# Patient Record
Sex: Male | Born: 1965 | Race: Black or African American | Hispanic: No | Marital: Married | Smoking: Former smoker
Health system: Southern US, Community
[De-identification: ages and names within clinical notes are randomized; demographics above are authoritative.]

## PROBLEM LIST (undated history)

## (undated) DIAGNOSIS — E119 Type 2 diabetes mellitus without complications: Secondary | ICD-10-CM

## (undated) HISTORY — PX: SHOULDER SURGERY: SHX246

## (undated) HISTORY — PX: ANKLE SURGERY: SHX546

## (undated) HISTORY — PX: KNEE SURGERY: SHX244

---

## 2000-10-16 ENCOUNTER — Emergency Department (HOSPITAL_COMMUNITY): Admission: EM | Admit: 2000-10-16 | Discharge: 2000-10-16 | Payer: Self-pay | Admitting: Emergency Medicine

## 2003-01-14 ENCOUNTER — Emergency Department (HOSPITAL_COMMUNITY): Admission: EM | Admit: 2003-01-14 | Discharge: 2003-01-14 | Payer: Self-pay | Admitting: Emergency Medicine

## 2003-01-22 ENCOUNTER — Emergency Department (HOSPITAL_COMMUNITY): Admission: EM | Admit: 2003-01-22 | Discharge: 2003-01-22 | Payer: Self-pay | Admitting: Emergency Medicine

## 2003-02-12 ENCOUNTER — Encounter: Payer: Self-pay | Admitting: Emergency Medicine

## 2003-02-12 ENCOUNTER — Inpatient Hospital Stay (HOSPITAL_COMMUNITY): Admission: EM | Admit: 2003-02-12 | Discharge: 2003-02-15 | Payer: Self-pay | Admitting: Emergency Medicine

## 2003-08-07 ENCOUNTER — Emergency Department (HOSPITAL_COMMUNITY): Admission: EM | Admit: 2003-08-07 | Discharge: 2003-08-07 | Payer: Self-pay | Admitting: Emergency Medicine

## 2005-08-14 ENCOUNTER — Emergency Department (HOSPITAL_COMMUNITY): Admission: EM | Admit: 2005-08-14 | Discharge: 2005-08-14 | Payer: Self-pay | Admitting: Emergency Medicine

## 2005-10-08 ENCOUNTER — Emergency Department (HOSPITAL_COMMUNITY): Admission: EM | Admit: 2005-10-08 | Discharge: 2005-10-08 | Payer: Self-pay | Admitting: Family Medicine

## 2005-10-26 ENCOUNTER — Inpatient Hospital Stay (HOSPITAL_COMMUNITY): Admission: EM | Admit: 2005-10-26 | Discharge: 2005-10-31 | Payer: Self-pay | Admitting: Emergency Medicine

## 2005-10-28 ENCOUNTER — Ambulatory Visit: Payer: Self-pay | Admitting: Physical Medicine & Rehabilitation

## 2005-11-08 ENCOUNTER — Emergency Department (HOSPITAL_COMMUNITY): Admission: EM | Admit: 2005-11-08 | Discharge: 2005-11-08 | Payer: Self-pay | Admitting: Emergency Medicine

## 2005-12-27 ENCOUNTER — Emergency Department (HOSPITAL_COMMUNITY): Admission: EM | Admit: 2005-12-27 | Discharge: 2005-12-27 | Payer: Self-pay | Admitting: Emergency Medicine

## 2006-06-22 ENCOUNTER — Emergency Department (HOSPITAL_COMMUNITY): Admission: EM | Admit: 2006-06-22 | Discharge: 2006-06-22 | Payer: Self-pay | Admitting: Emergency Medicine

## 2006-08-04 ENCOUNTER — Emergency Department (HOSPITAL_COMMUNITY): Admission: EM | Admit: 2006-08-04 | Discharge: 2006-08-04 | Payer: Self-pay | Admitting: Emergency Medicine

## 2006-08-10 ENCOUNTER — Emergency Department (HOSPITAL_COMMUNITY): Admission: EM | Admit: 2006-08-10 | Discharge: 2006-08-10 | Payer: Self-pay | Admitting: Emergency Medicine

## 2007-08-28 ENCOUNTER — Emergency Department (HOSPITAL_COMMUNITY): Admission: EM | Admit: 2007-08-28 | Discharge: 2007-08-28 | Payer: Self-pay | Admitting: Emergency Medicine

## 2008-01-08 ENCOUNTER — Emergency Department (HOSPITAL_COMMUNITY): Admission: EM | Admit: 2008-01-08 | Discharge: 2008-01-08 | Payer: Self-pay | Admitting: Emergency Medicine

## 2008-01-21 ENCOUNTER — Emergency Department: Payer: Self-pay | Admitting: Emergency Medicine

## 2008-02-20 ENCOUNTER — Emergency Department (HOSPITAL_COMMUNITY): Admission: EM | Admit: 2008-02-20 | Discharge: 2008-02-20 | Payer: Self-pay | Admitting: Emergency Medicine

## 2008-03-15 ENCOUNTER — Emergency Department (HOSPITAL_COMMUNITY): Admission: EM | Admit: 2008-03-15 | Discharge: 2008-03-15 | Payer: Self-pay | Admitting: Emergency Medicine

## 2008-05-20 ENCOUNTER — Emergency Department (HOSPITAL_COMMUNITY): Admission: EM | Admit: 2008-05-20 | Discharge: 2008-05-20 | Payer: Self-pay | Admitting: Emergency Medicine

## 2008-09-12 ENCOUNTER — Emergency Department (HOSPITAL_COMMUNITY): Admission: EM | Admit: 2008-09-12 | Discharge: 2008-09-12 | Payer: Self-pay | Admitting: Emergency Medicine

## 2009-07-01 ENCOUNTER — Emergency Department (HOSPITAL_COMMUNITY): Admission: EM | Admit: 2009-07-01 | Discharge: 2009-07-02 | Payer: Self-pay | Admitting: Emergency Medicine

## 2009-10-16 ENCOUNTER — Encounter: Admission: RE | Admit: 2009-10-16 | Discharge: 2009-11-19 | Payer: Self-pay | Admitting: Orthopedic Surgery

## 2010-08-04 ENCOUNTER — Emergency Department (HOSPITAL_COMMUNITY): Payer: Self-pay

## 2010-08-04 ENCOUNTER — Emergency Department (HOSPITAL_COMMUNITY)
Admission: EM | Admit: 2010-08-04 | Discharge: 2010-08-04 | Disposition: A | Discharge status: Home / Self Care | Payer: Self-pay | Attending: Emergency Medicine | Admitting: Emergency Medicine

## 2010-08-04 DIAGNOSIS — M542 Cervicalgia: Secondary | ICD-10-CM | POA: Insufficient documentation

## 2010-08-04 DIAGNOSIS — G8929 Other chronic pain: Secondary | ICD-10-CM | POA: Insufficient documentation

## 2010-08-04 DIAGNOSIS — Z79899 Other long term (current) drug therapy: Secondary | ICD-10-CM | POA: Insufficient documentation

## 2010-08-04 DIAGNOSIS — M549 Dorsalgia, unspecified: Secondary | ICD-10-CM | POA: Insufficient documentation

## 2010-08-04 DIAGNOSIS — R079 Chest pain, unspecified: Secondary | ICD-10-CM | POA: Insufficient documentation
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
## 2013-11-01 ENCOUNTER — Encounter (HOSPITAL_COMMUNITY): Payer: Self-pay | Admitting: Emergency Medicine

## 2013-11-01 ENCOUNTER — Emergency Department (INDEPENDENT_AMBULATORY_CARE_PROVIDER_SITE_OTHER)
Admission: EM | Admit: 2013-11-01 | Discharge: 2013-11-01 | Disposition: A | Discharge status: Home / Self Care | Payer: Self-pay | Source: Home / Self Care | Attending: Family Medicine | Admitting: Family Medicine

## 2013-11-01 DIAGNOSIS — Z760 Encounter for issue of repeat prescription: Secondary | ICD-10-CM

## 2013-11-01 DIAGNOSIS — E119 Type 2 diabetes mellitus without complications: Secondary | ICD-10-CM

## 2013-11-01 HISTORY — DX: Type 2 diabetes mellitus without complications: E11.9

## 2013-11-01 LAB — POCT I-STAT, CHEM 8
BUN: 9 mg/dL (ref 6–23)
CHLORIDE: 104 meq/L (ref 96–112)
CREATININE: 1 mg/dL (ref 0.50–1.35)
Calcium, Ion: 1.24 mmol/L — ABNORMAL HIGH (ref 1.12–1.23)
Glucose, Bld: 169 mg/dL — ABNORMAL HIGH (ref 70–99)
HCT: 48 % (ref 39.0–52.0)
HEMOGLOBIN: 16.3 g/dL (ref 13.0–17.0)
Potassium: 3.6 mEq/L — ABNORMAL LOW (ref 3.7–5.3)
SODIUM: 142 meq/L (ref 137–147)
TCO2: 22 mmol/L (ref 0–100)

## 2013-11-01 LAB — POCT URINALYSIS DIP (DEVICE)
Bilirubin Urine: NEGATIVE
Glucose, UA: NEGATIVE mg/dL
KETONES UR: NEGATIVE mg/dL
LEUKOCYTES UA: NEGATIVE
NITRITE: NEGATIVE
Protein, ur: NEGATIVE mg/dL
SPECIFIC GRAVITY, URINE: 1.02 (ref 1.005–1.030)
Urobilinogen, UA: 0.2 mg/dL (ref 0.0–1.0)
pH: 6 (ref 5.0–8.0)

## 2013-11-01 NOTE — Discharge Instructions (Signed)
Blood Glucose Monitoring, Adult Monitoring your blood glucose (also know as blood sugar) helps you to manage your diabetes. It also helps you and your health care provider monitor your diabetes and determine how well your treatment plan is working. WHY SHOULD YOU MONITOR YOUR BLOOD GLUCOSE?  It can help you understand how food, exercise, and medicine affect your blood glucose.  It allows you to know what your blood glucose is at any given moment. You can quickly tell if you are having low blood glucose (hypoglycemia) or high blood glucose (hyperglycemia).  It can help you and your health care provider know how to adjust your medicines.  It can help you understand how to manage an illness or adjust medicine for exercise. WHEN SHOULD YOU TEST? Your health care provider will help you decide how often you should check your blood glucose. This may depend on the type of diabetes you have, your diabetes control, or the types of medicines you are taking. Be sure to write down all of your blood glucose readings so that this information can be reviewed with your health care provider. See below for examples of testing times that your health care provider may suggest. Type 1 Diabetes  Test 4 times a day if you are in good control, using an insulin pump, or perform multiple daily injections.  If your diabetes is not well-controlled or if you are sick, you may need to monitor more often.  It is a good idea to also monitor:  Before and after exercise.  Between meals and 2 hours after a meal.  Occasionally between 2:00 to 3:00 am. Type 2 Diabetes  It can vary with each person, but generally, if you are on insulin, test 4 times a day.  If you take medicines by mouth (orally), test 2 times a day.  If you are on a controlled diet, test once a day.  If your diabetes is not well controlled or if you are sick, you may need to monitor more often. HOW TO MONITOR YOUR BLOOD GLUCOSE Supplies  Needed  Blood glucose meter.  Test strips for your meter. Each meter has its own strips. You must use the strips that go with your own meter.  A pricking needle (lancet).  A device that holds the lancet (lancing device).  A journal or log book to write down your results. Procedure  Wash your hands with soap and water. Alcohol is not preferred.  Prick the side of your finger (not the tip) with the lancet.  Gently milk the finger until a small drop of blood appears.  Follow the instructions that come with your meter for inserting the test strip, applying blood to the strip, and using your blood glucose meter. Other Areas to Get Blood for Testing Some meters allow you to use other areas of your body (other than your finger) to test your blood. These areas are called alternative sites. The most common alternative sites are:  The forearm.  The thigh.  The back area of the lower leg.  The palm of the hand. The blood flow in these areas is slower. Therefore, the blood glucose values you get may be delayed, and the numbers are different from what you would get from your fingers. Do not use alternative sites if you think you are having hypoglycemia. Your reading will not be accurate. Always use a finger if you are having hypoglycemia. Also, if you cannot feel your lows (hypoglycemia unawareness), always use your fingers for your blood  glucose checks. ADDITIONAL TIPS FOR GLUCOSE MONITORING  Do not reuse lancets.  Always carry your supplies with you.  All blood glucose meters have a 24-hour "hotline" number to call if you have questions or need help.  Adjust (calibrate) your blood glucose meter with a control solution after finishing a few boxes of strips. BLOOD GLUCOSE RECORD KEEPING It is a good idea to keep a daily record or log of your blood glucose readings. Most glucose meters, if not all, keep your glucose records stored in the meter. Some meters come with the ability to download  your records to your home computer. Keeping a record of your blood glucose readings is especially helpful if you are wanting to look for patterns. Make notes to go along with the blood glucose readings because you might forget what happened at that exact time. Keeping good records helps you and your health care provider to work together to achieve good diabetes management.  Document Released: 06/03/2003 Document Revised: 01/31/2013 Document Reviewed: 10/23/2012 [facility] Patient Information 2014 [place].  Diabetes and Exercise Exercising regularly is important. It is not just about losing weight. It has many health benefits, such as:  Improving your overall fitness, flexibility, and endurance.  Increasing your bone density.  Helping with weight control.  Decreasing your body fat.  Increasing your muscle strength.  Reducing stress and tension.  Improving your overall health. People with diabetes who exercise gain additional benefits because exercise:  Reduces appetite.  Improves the body's use of blood sugar (glucose).  Helps lower or control blood glucose.  Decreases blood pressure.  Helps control blood lipids (such as cholesterol and triglycerides).  Improves the body's use of the hormone insulin by:  Increasing the body's insulin sensitivity.  Reducing the body's insulin needs.  Decreases the risk for heart disease because exercising:  Lowers cholesterol and triglycerides levels.  Increases the levels of good cholesterol (such as high-density lipoproteins [HDL]) in the body.  Lowers blood glucose levels. YOUR ACTIVITY PLAN  Choose an activity that you enjoy and set realistic goals. Your health care provider or diabetes educator can help you make an activity plan that works for you. You can break activities into 2 or 3 sessions throughout the day. Doing so is as good as one long session. Exercise ideas include:  Taking the dog for a walk.  Taking the stairs  instead of the elevator.  Dancing to your favorite song.  Doing your favorite exercise with a friend. RECOMMENDATIONS FOR EXERCISING WITH TYPE 1 OR TYPE 2 DIABETES   Check your blood glucose before exercising. If blood glucose levels are greater than 240 mg/dL, check for urine ketones. Do not exercise if ketones are present.  Avoid injecting insulin into areas of the body that are going to be exercised. For example, avoid injecting insulin into:  The arms when playing tennis.  The legs when jogging.  Keep a record of:  Food intake before and after you exercise.  Expected peak times of insulin action.  Blood glucose levels before and after you exercise.  The type and amount of exercise you have done.  Review your records with your health care provider. Your health care provider will help you to develop guidelines for adjusting food intake and insulin amounts before and after exercising.  If you take insulin or oral hypoglycemic agents, watch for signs and symptoms of hypoglycemia. They include:  Dizziness.  Shaking.  Sweating.  Chills.  Confusion.  Drink plenty of water while you exercise  to prevent dehydration or heat stroke. Body water is lost during exercise and must be replaced.  Talk to your health care provider before starting an exercise program to make sure it is safe for you. Remember, almost any type of activity is better than none. Document Released: 08/21/2003 Document Revised: 01/31/2013 Document Reviewed: 11/07/2012 [facility] Patient Information 2014 [place].  Diabetes and Foot Care Diabetes may cause you to have problems because of poor blood supply (circulation) to your feet and legs. This may cause the skin on your feet to become thinner, break easier, and heal more slowly. Your skin may become dry, and the skin may peel and crack. You may also have nerve damage in your legs and feet causing decreased feeling in them. You may not notice minor  injuries to your feet that could lead to infections or more serious problems. Taking care of your feet is one of the most important things you can do for yourself.  HOME CARE INSTRUCTIONS  Wear shoes at all times, even in the house. Do not go barefoot. Bare feet are easily injured.  Check your feet daily for blisters, cuts, and redness. If you cannot see the bottom of your feet, use a mirror or ask someone for help.  Wash your feet with warm water (do not use hot water) and mild soap. Then pat your feet and the areas between your toes until they are completely dry. Do not soak your feet as this can dry your skin.  Apply a moisturizing lotion or petroleum jelly (that does not contain alcohol and is unscented) to the skin on your feet and to dry, brittle toenails. Do not apply lotion between your toes.  Trim your toenails straight across. Do not dig under them or around the cuticle. File the edges of your nails with an emery board or nail file.  Do not cut corns or calluses or try to remove them with medicine.  Wear clean socks or stockings every day. Make sure they are not too tight. Do not wear knee-high stockings since they may decrease blood flow to your legs.  Wear shoes that fit properly and have enough cushioning. To break in new shoes, wear them for just a few hours a day. This prevents you from injuring your feet. Always look in your shoes before you put them on to be sure there are no objects inside.  Do not cross your legs. This may decrease the blood flow to your feet.  If you find a minor scrape, cut, or break in the skin on your feet, keep it and the skin around it clean and dry. These areas may be cleansed with mild soap and water. Do not cleanse the area with peroxide, alcohol, or iodine.  When you remove an adhesive bandage, be sure not to damage the skin around it.  If you have a wound, look at it several times a day to make sure it is healing.  Do not use heating pads or  hot water bottles. They may burn your skin. If you have lost feeling in your feet or legs, you may not know it is happening until it is too late.  Make sure your health care provider performs a complete foot exam at least annually or more often if you have foot problems. Report any cuts, sores, or bruises to your health care provider immediately. SEEK MEDICAL CARE IF:   You have an injury that is not healing.  You have cuts or breaks in  the skin.  You have an ingrown nail.  You notice redness on your legs or feet.  You feel burning or tingling in your legs or feet.  You have pain or cramps in your legs and feet.  Your legs or feet are numb.  Your feet always feel cold. SEEK IMMEDIATE MEDICAL CARE IF:   There is increasing redness, swelling, or pain in or around a wound.  There is a red line that goes up your leg.  Pus is coming from a wound.  You develop a fever or as directed by your health care provider.  You notice a bad smell coming from an ulcer or wound. Document Released: 05/28/2000 Document Revised: 01/31/2013 Document Reviewed: 11/07/2012 [facility] Patient Information 2014 [place], [place].  Diabetes Meal Planning Guide The diabetes meal planning guide is a tool to help you plan your meals and snacks. It is important for people with diabetes to manage their blood glucose (sugar) levels. Choosing the right foods and the right amounts throughout your day will help control your blood glucose. Eating right can even help you improve your blood pressure and reach or maintain a healthy weight. CARBOHYDRATE COUNTING MADE EASY When you eat carbohydrates, they turn to sugar. This raises your blood glucose level. Counting carbohydrates can help you control this level so you feel better. When you plan your meals by counting carbohydrates, you can have more flexibility in what you eat and balance your medicine with your food intake. Carbohydrate counting simply means adding up the  total amount of carbohydrate grams in your meals and snacks. Try to eat about the same amount at each meal. Foods with carbohydrates are listed below. Each portion below is 1 carbohydrate serving or 15 grams of carbohydrates. Ask your dietician how many grams of carbohydrates you should eat at each meal or snack. Grains and Starches  1 slice bread.   English muffin or hotdog/hamburger bun.   cup cold cereal (unsweetened).   cup cooked pasta or rice.   cup starchy vegetables (corn, potatoes, peas, beans, winter squash).  1 tortilla (6 inches).   bagel.  1 waffle or pancake (size of a CD).   cup cooked cereal.  4 to 6 small crackers. *Whole grain is recommended. Fruit  1 cup fresh unsweetened berries, melon, papaya, pineapple.  1 small fresh fruit.   banana or mango.   cup fruit juice (4 oz unsweetened).   cup canned fruit in natural juice or water.  2 tbs dried fruit.  12 to 15 grapes or cherries. Milk and Yogurt  1 cup fat-free or 1% milk.  1 cup soy milk.  6 oz light yogurt with sugar-free sweetener.  6 oz low-fat soy yogurt.  6 oz plain yogurt. Vegetables  1 cup raw or  cup cooked is counted as 0 carbohydrates or a "free" food.  If you eat 3 or more servings at 1 meal, count them as 1 carbohydrate serving. Other Carbohydrates   oz chips or pretzels.   cup ice cream or frozen yogurt.   cup sherbet or sorbet.  2 inch square cake, no frosting.  1 tbs honey, sugar, jam, jelly, or syrup.  2 small cookies.  3 squares of Linse crackers.  3 cups popcorn.  6 crackers.  1 cup broth-based soup.  Count 1 cup casserole or other mixed foods as 2 carbohydrate servings.  Foods with less than 20 calories in a serving may be counted as 0 carbohydrates or a "free" food. You may want  to purchase a book or computer software that lists the carbohydrate gram counts of different foods. In addition, the nutrition facts panel on the labels of the  foods you eat are a good source of this information. The label will tell you how big the serving size is and the total number of carbohydrate grams you will be eating per serving. Divide this number by 15 to obtain the number of carbohydrate servings in a portion. Remember, 1 carbohydrate serving equals 15 grams of carbohydrate. SERVING SIZES Measuring foods and serving sizes helps you make sure you are getting the right amount of food. The list below tells how big or small some common serving sizes are.  1 oz.........4 stacked dice.  3 oz........Marland Kitchen.Deck of cards.  1 tsp.......Marland Kitchen.Tip of little finger.  1 tbs......Marland Kitchen.Marland Kitchen.Thumb.  2 tbs.......Marland Kitchen.Golf ball.   cup......Marland Kitchen.Half of a fist.  1 cup.......Marland Kitchen.A fist. SAMPLE DIABETES MEAL PLAN Below is a sample meal plan that includes foods from the grain and starches, dairy, vegetable, fruit, and meat groups. A dietician can individualize a meal plan to fit your calorie needs and tell you the number of servings needed from each food group. However, controlling the total amount of carbohydrates in your meal or snack is more important than making sure you include all of the food groups at every meal. You may interchange carbohydrate containing foods (dairy, starches, and fruits). The meal plan below is an example of a 2000 calorie diet using carbohydrate counting. This meal plan has 17 carbohydrate servings. Breakfast  1 cup oatmeal (2 carb servings).   cup light yogurt (1 carb serving).  1 cup blueberries (1 carb serving).   cup almonds. Snack  1 large apple (2 carb servings).  1 low-fat string cheese stick. Lunch  Chicken breast salad.  1 cup spinach.   cup chopped tomatoes.  2 oz chicken breast, sliced.  2 tbs low-fat [place] dressing.  12 whole-wheat crackers (2 carb servings).  12 to 15 grapes (1 carb serving).  1 cup low-fat milk (1 carb serving). Snack  1 cup carrots.   cup hummus (1 carb serving). Dinner  3 oz broiled  salmon.  1 cup brown rice (3 carb servings). Snack  1  cups steamed broccoli (1 carb serving) drizzled with 1 tsp olive oil and lemon juice.  1 cup light pudding (2 carb servings). DIABETES MEAL PLANNING WORKSHEET Your dietician can use this worksheet to help you decide how many servings of foods and what types of foods are right for you.  BREAKFAST Food Group and Servings / Carb Servings Grain/Starches __________________________________ Dairy __________________________________________ Vegetable ______________________________________ Fruit ___________________________________________ Meat __________________________________________ Fat ____________________________________________ LUNCH Food Group and Servings / Carb Servings Grain/Starches ___________________________________ Dairy ___________________________________________ Fruit ____________________________________________ Meat ___________________________________________ Fat _____________________________________________ Laural GoldenINNER Food Group and Servings / Carb Servings Grain/Starches ___________________________________ Dairy ___________________________________________ Fruit ____________________________________________ Meat ___________________________________________ Fat _____________________________________________ SNACKS Food Group and Servings / Carb Servings Grain/Starches ___________________________________ Dairy ___________________________________________ Vegetable _______________________________________ Fruit ____________________________________________ Meat ___________________________________________ Fat _____________________________________________ DAILY TOTALS Starches _________________________ Vegetable ________________________ Fruit ____________________________ Dairy ____________________________ Meat ____________________________ Fat ______________________________ Document Released: 02/25/2005 Document Revised:  08/23/2011 Document Reviewed: 01/06/2009 [employer] Patient Information 2014 [place].

## 2013-11-01 NOTE — ED Provider Notes (Signed)
CSN: [id number]     Arrival date & time 11/01/13  1031 History   First MD Initiated Contact with Patient 11/01/13 1051     Chief Complaint  Patient presents with  . Medication Refill   (Consider location/radiation/quality/duration/timing/severity/associated sxs/prior Treatment) HPI Comments: Patient was released from prison one week ago and presents requesting refill Rx's for his insulin, glucometer testing supplies, and medication for chronic pain. [facility] but initial appointment is not until 11-21-2013.  Denies polyuria and polydipsia.    The history is provided by the patient.    Past Medical History  Diagnosis Date  . Diabetes mellitus without complication    Past Surgical History  Procedure Laterality Date  . Knee surgery    . Ankle surgery    . Shoulder surgery     History reviewed. No pertinent family history. History  Substance Use Topics  . Smoking status: Current Every Day Smoker  . Smokeless tobacco: Not on file  . Alcohol Use: No    Review of Systems  Constitutional: Negative.   HENT: Negative.   Eyes: Negative.   Respiratory: Negative.   Cardiovascular: Negative.   Gastrointestinal: Negative.   Endocrine: Negative for polydipsia, polyphagia and polyuria.  Genitourinary: Negative.   Musculoskeletal: Negative.   Skin: Negative.   Neurological: Negative.   Psychiatric/Behavioral: Negative.     Allergies  Review of patient's allergies indicates not on file.  Home Medications   Prior to Admission medications   Not on File   BP 130/84  Pulse 68  Temp(Src) 97.1 F (36.2 C) (Oral)  Resp 18  SpO2 100% Physical Exam  Nursing note and vitals reviewed. Constitutional: He is oriented to person, place, and time. He appears well-developed and well-nourished. No distress.  HENT:  Head: Normocephalic and atraumatic.  Eyes: Conjunctivae are normal. No scleral icterus.  Neck:  Normal range of motion. Neck supple.  Cardiovascular: Normal rate, regular rhythm and normal heart sounds.   Pulmonary/Chest: Effort normal and breath sounds normal.  Abdominal: Soft. Bowel sounds are normal. He exhibits no distension. There is no tenderness.  Musculoskeletal: Normal range of motion.  Neurological: He is alert and oriented to person, place, and time.  Skin: Skin is warm and dry.  Psychiatric: He has a normal mood and affect. His behavior is normal.    ED Course  Procedures (including critical care time) Labs Review Labs Reviewed  POCT URINALYSIS DIP (DEVICE) - Abnormal; Notable for the following:    Hgb urine dipstick TRACE (*)    All other components within normal limits  POCT I-STAT, CHEM 8 - Abnormal; Notable for the following:    Potassium 3.6 (*)    Glucose, Bld 169 (*)    Calcium, Ion 1.24 (*)    All other components within normal limits    Imaging Review No results found.   MDM   1. Diabetes   2. Encounter for medication refill    Advised patient that he would need to discuss chronic pain management with his provider [facility]. Encourage to use either tylenol or ibuprofen as directed on packaging for pain. Provided patient with prescriptions for insulin 70/30, 20units SQ Qam and insulin 70/30 10 until SQ Qpm, glucometer test strips, lancets, and insulin syringes. These were placed on hand written prescriptions and duplicate copies to be scanned into patient's chart. UA and Istat 8 only notable for mild hyperglcemia.  Jess BartersJennifer Lee LorenzoPresson, GeorgiaPA 11/01/13 1330

## 2013-11-01 NOTE — ED Notes (Signed)
Pt  Reports   He is  A  Diabetic   And  Needs     His  Diabetic  meds  And   Pain  Pills       He  Reports  He  Is  Also    Out  Of his  supplys  As  Well            He  Apparently    Has  No PCP       He  Is  Sitting  Upright  On  The  Exam  Table  Speaking      In  Complete  sentances  And  Is  In no  Acute  Distress

## 2013-11-05 NOTE — ED Provider Notes (Signed)
Medical screening examination/treatment/procedure(s) were performed by a resident physician or non-physician practitioner and as the supervising physician I was immediately available for consultation/collaboration.  Clementeen Fasig, MD    Rodolph Bong, MD 11/05/13 [phone]

## 2013-11-16 ENCOUNTER — Emergency Department (HOSPITAL_COMMUNITY)
Admission: EM | Admit: 2013-11-16 | Discharge: 2013-11-16 | Disposition: A | Discharge status: Home / Self Care | Payer: Medicaid Other | Attending: Emergency Medicine | Admitting: Emergency Medicine

## 2013-11-16 ENCOUNTER — Encounter (HOSPITAL_COMMUNITY): Payer: Self-pay | Admitting: Emergency Medicine

## 2013-11-16 DIAGNOSIS — Z87891 Personal history of nicotine dependence: Secondary | ICD-10-CM | POA: Insufficient documentation

## 2013-11-16 DIAGNOSIS — E119 Type 2 diabetes mellitus without complications: Secondary | ICD-10-CM | POA: Insufficient documentation

## 2013-11-16 DIAGNOSIS — G8929 Other chronic pain: Secondary | ICD-10-CM | POA: Diagnosis not present

## 2013-11-16 DIAGNOSIS — M25552 Pain in left hip: Secondary | ICD-10-CM

## 2013-11-16 DIAGNOSIS — M25569 Pain in unspecified knee: Secondary | ICD-10-CM | POA: Insufficient documentation

## 2013-11-16 DIAGNOSIS — M25562 Pain in left knee: Secondary | ICD-10-CM

## 2013-11-16 DIAGNOSIS — M25559 Pain in unspecified hip: Secondary | ICD-10-CM | POA: Diagnosis not present

## 2013-11-16 DIAGNOSIS — M25561 Pain in right knee: Secondary | ICD-10-CM

## 2013-11-16 MED ORDER — GABAPENTIN 100 MG PO CAPS: 100.0000 mg | ORAL_CAPSULE | Freq: Two times a day (BID) | ORAL | Status: AC

## 2013-11-16 NOTE — ED Notes (Signed)
Pt refused wc. Ambulates without distress.
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
## 2013-11-16 NOTE — ED Notes (Signed)
Pt states recently released from prison, does not have medication for chronic bilateral knee pain and left hip pain. Denies new injury.

## 2013-11-16 NOTE — ED Notes (Signed)
Accessed record for billing question

## 2013-11-16 NOTE — Discharge Instructions (Signed)
Read the information below.  Use the prescribed medication as directed.  Please discuss all new medications with your pharmacist.  You may return to the Emergency Department at any time for worsening condition or any new symptoms that concern you.  If you develop uncontrolled pain, weakness or numbness of the extremity, severe discoloration of the skin, or you are unable to walk, return to the ER for a recheck.      Chronic Pain Chronic pain can be defined as pain that is off and on and lasts for 3 6 months or longer. Many things cause chronic pain, which can make it difficult to make a diagnosis. There are many treatment options available for chronic pain. However, finding a treatment that works well for you may require trying various approaches until the right one is found. Many people benefit from a combination of two or more types of treatment to control their pain. SYMPTOMS  Chronic pain can occur anywhere in the body and can range from mild to very severe. Some types of chronic pain include:  Headache.  Low back pain.  Cancer pain.  Arthritis pain.  Neurogenic pain. This is pain resulting from damage to nerves. People with chronic pain may also have other symptoms such as:  Depression.  Anger.  Insomnia.  Anxiety. DIAGNOSIS  Your health care provider will help diagnose your condition over time. In many cases, the initial focus will be on excluding possible conditions that could be causing the pain. Depending on your symptoms, your health care provider may order tests to diagnose your condition. Some of these tests may include:   Blood tests.   CT scan.   MRI.   X-rays.   Ultrasounds.   Nerve conduction studies.  You may need to see a specialist.  TREATMENT  Finding treatment that works well may take time. You may be referred to a pain specialist. He or she may prescribe medicine or therapies, such as:   Mindful meditation or yoga.  Shots (injections) of  numbing or pain-relieving medicines into the spine or area of pain.  Local electrical stimulation.  Acupuncture.   Massage therapy.   Aroma, color, light, or sound therapy.   Biofeedback.   Working with a physical therapist to keep from getting stiff.   Regular, gentle exercise.   Cognitive or behavioral therapy.   Group support.  Sometimes, surgery may be recommended.  HOME CARE INSTRUCTIONS   Take all medicines as directed by your health care provider.   Lessen stress in your life by relaxing and doing things such as listening to calming music.   Exercise or be active as directed by your health care provider.   Eat a healthy diet and include things such as vegetables, fruits, fish, and lean meats in your diet.   Keep all follow-up appointments with your health care provider.   Attend a support group with others suffering from chronic pain. SEEK MEDICAL CARE IF:   Your pain gets worse.   You develop a new pain that was not there before.   You cannot tolerate medicines given to you by your health care provider.   You have new symptoms since your last visit with your health care provider.  SEEK IMMEDIATE MEDICAL CARE IF:   You feel weak.   You have decreased sensation or numbness.   You lose control of bowel or bladder function.   Your pain suddenly gets much worse.   You develop shaking.  You develop chills.  You  develop confusion.  You develop chest pain.  You develop shortness of breath.  MAKE SURE YOU:  Understand these instructions.  Will watch your condition.  Will get help right away if you are not doing well or get worse. Document Released: 02/20/2002 Document Revised: 01/31/2013 Document Reviewed: 11/24/2012 [facility] Patient Information 2014 [place], [place].  Knee Pain Knee pain can be a result of an injury or other medical conditions. Treatment will depend on the cause of your pain. HOME CARE  Only take medicine  as told by your doctor.  Keep a healthy weight. Being overweight can make the knee hurt more.  Stretch before exercising or playing sports.  If there is constant knee pain, change the way you exercise. Ask your doctor for advice.  Make sure shoes fit well. Choose the right shoe for the sport or activity.  Protect your knees. Wear kneepads if needed.  Rest when you are tired. GET HELP RIGHT AWAY IF:   Your knee pain does not stop.  Your knee pain does not get better.  Your knee joint feels hot to the touch.  You have a fever. MAKE SURE YOU:   Understand these instructions.  Will watch this condition.  Will get help right away if you are not doing well or get worse. Document Released: 08/27/2008 Document Revised: 08/23/2011 Document Reviewed: 08/27/2008 [facility] Patient Information 2014 [place], [place].  Hip Pain The hips join the upper legs to the lower pelvis. The bones, cartilage, tendons, and muscles of the hip joint perform a lot of work each day holding your body weight and allowing you to move around. Hip pain is a common symptom. It can range from a minor ache to severe pain on 1 or both hips. Pain may be felt on the inside of the hip joint near the groin, or the outside near the buttocks and upper thigh. There may be swelling or stiffness as well. It occurs more often when a person walks or performs activity. There are many reasons hip pain can develop. CAUSES  It is important to work with your caregiver to identify the cause since many conditions can impact the bones, cartilage, muscles, and tendons of the hips. Causes for hip pain include:  Broken (fractured) bones.  Separation of the thighbone from the hip socket (dislocation).  Torn cartilage of the hip joint.  Swelling (inflammation) of a tendon (tendonitis), the sac within the hip joint (bursitis), or a joint.  A weakening in the abdominal wall (hernia), affecting the nerves to the hip.  Arthritis in the  hip joint or lining of the hip joint.  Pinched nerves in the back, hip, or upper thigh.  A bulging disc in the spine (herniated disc).  Rarely, bone infection or cancer. DIAGNOSIS  The location of your hip pain will help your caregiver understand what may be causing the pain. A diagnosis is based on your medical history, your symptoms, results from your physical exam, and results from diagnostic tests. Diagnostic tests may include X-ray exams, a computerized magnetic scan (magnetic resonance imaging, MRI), or bone scan. TREATMENT  Treatment will depend on the cause of your hip pain. Treatment may include:  Limiting activities and resting until symptoms improve.  Crutches or other walking supports (a cane or brace).  Ice, elevation, and compression.  Physical therapy or home exercises.  Shoe inserts or special shoes.  Losing weight.  Medications to reduce pain.  Undergoing surgery. HOME CARE INSTRUCTIONS   Only take over-the-counter or prescription medicines for  pain, discomfort, or fever as directed by your caregiver.  Put ice on the injured area:  Put ice in a plastic bag.  Place a towel between your skin and the bag.  Leave the ice on for 15-20 minutes at a time, 03-04 times a day.  Keep your leg raised (elevated) when possible to lessen swelling.  Avoid activities that cause pain.  Follow specific exercises as directed by your caregiver.  Sleep with a pillow between your legs on your most comfortable side.  Record how often you have hip pain, the location of the pain, and what it feels like. This information may be helpful to you and your caregiver.  Ask your caregiver about returning to work or sports and whether you should drive.  Follow up with your caregiver for further exams, therapy, or testing as directed. SEEK MEDICAL CARE IF:   Your pain or swelling continues or worsens after 1 week.  You are feeling unwell or have chills.  You have increasing  difficulty with walking.  You have a loss of sensation or other new symptoms.  You have questions or concerns. SEEK IMMEDIATE MEDICAL CARE IF:   You cannot put weight on the affected hip.  You have fallen.  You have a sudden increase in pain and swelling in your hip.  You have a fever. MAKE SURE YOU:   Understand these instructions.  Will watch your condition.  Will get help right away if you are not doing well or get worse. Document Released: 11/18/2009 Document Revised: 08/23/2011 Document Reviewed: 11/18/2009 [facility] Patient Information 2014 [place], [place].

## 2013-11-17 NOTE — ED Provider Notes (Signed)
Medical screening examination/treatment/procedure(s) were performed by non-physician practitioner and as supervising physician I was immediately available for consultation/collaboration.   EKG Interpretation None       Flint Melter, MD 11/17/13 0900

## 2013-11-21 ENCOUNTER — Ambulatory Visit: Payer: Medicaid Other | Attending: Internal Medicine

## 2013-11-28 ENCOUNTER — Encounter (HOSPITAL_COMMUNITY): Payer: Self-pay | Admitting: Emergency Medicine

## 2013-11-28 ENCOUNTER — Emergency Department (HOSPITAL_COMMUNITY)
Admission: EM | Admit: 2013-11-28 | Discharge: 2013-11-28 | Disposition: A | Discharge status: Home / Self Care | Payer: Medicaid Other | Attending: Emergency Medicine | Admitting: Emergency Medicine

## 2013-11-28 DIAGNOSIS — E119 Type 2 diabetes mellitus without complications: Secondary | ICD-10-CM | POA: Diagnosis not present

## 2013-11-28 DIAGNOSIS — K0889 Other specified disorders of teeth and supporting structures: Secondary | ICD-10-CM

## 2013-11-28 DIAGNOSIS — Z87891 Personal history of nicotine dependence: Secondary | ICD-10-CM | POA: Diagnosis not present

## 2013-11-28 DIAGNOSIS — K089 Disorder of teeth and supporting structures, unspecified: Secondary | ICD-10-CM | POA: Diagnosis present

## 2013-11-28 DIAGNOSIS — Z79899 Other long term (current) drug therapy: Secondary | ICD-10-CM | POA: Diagnosis not present

## 2013-11-28 DIAGNOSIS — K08109 Complete loss of teeth, unspecified cause, unspecified class: Secondary | ICD-10-CM | POA: Insufficient documentation

## 2013-11-28 MED ORDER — HYDROCODONE-ACETAMINOPHEN 5-325 MG PO TABS: 1.0000 | ORAL_TABLET | ORAL | Status: DC | PRN

## 2013-11-28 MED ORDER — PENICILLIN V POTASSIUM 500 MG PO TABS
500.0000 mg | ORAL_TABLET | Freq: Once | ORAL | Status: AC
  Administered 2013-11-28: 500 mg via ORAL
  Filled 2013-11-28: qty 1

## 2013-11-28 MED ORDER — HYDROCODONE-ACETAMINOPHEN 5-325 MG PO TABS
2.0000 | ORAL_TABLET | Freq: Once | ORAL | Status: AC
  Administered 2013-11-28: 2 via ORAL
  Filled 2013-11-28: qty 2

## 2013-11-28 MED ORDER — PENICILLIN V POTASSIUM 500 MG PO TABS: 500.0000 mg | ORAL_TABLET | Freq: Four times a day (QID) | ORAL | Status: DC

## 2013-11-28 NOTE — ED Provider Notes (Signed)
CSN: [id number]     Arrival date & time 11/28/13  0818 History   First MD Initiated Contact with Patient 11/28/13 1018     Chief Complaint  Patient presents with  . Dental Pain     (Consider location/radiation/quality/duration/timing/severity/associated sxs/prior Treatment) HPI Comments: The patient is a 48 year old male who presents with dental pain that started gradually last night. The dental pain is severe, constant and progressively worsening. The pain is aching and located in left upper jaw. The pain does not radiate. Eating makes the pain worse. Nothing makes the pain better. The patient has not tried anything for pain. No associated symptoms. Patient denies headache, neck pain/stiffness, fever, NVD, edema, sore throat, throat swelling, wheezing, SOB, chest pain, abdominal pain.      Past Medical History  Diagnosis Date  . Diabetes mellitus without complication    Past Surgical History  Procedure Laterality Date  . Knee surgery    . Ankle surgery    . Shoulder surgery     History reviewed. No pertinent family history. History  Substance Use Topics  . Smoking status: Former Games developermoker  . Smokeless tobacco: Not on file  . Alcohol Use: No    Review of Systems  Constitutional: Negative for fever, chills and fatigue.  HENT: Positive for dental problem. Negative for trouble swallowing.   Eyes: Negative for visual disturbance.  Respiratory: Negative for shortness of breath.   Cardiovascular: Negative for chest pain and palpitations.  Gastrointestinal: Negative for nausea, vomiting, abdominal pain and diarrhea.  Genitourinary: Negative for dysuria and difficulty urinating.  Musculoskeletal: Negative for arthralgias and neck pain.  Skin: Negative for color change.  Neurological: Negative for dizziness and weakness.  Psychiatric/Behavioral: Negative for dysphoric mood.      Allergies  Review of patient's allergies indicates no known allergies.  Home Medications   Prior  to Admission medications   Medication Sig Start Date End Date Taking? Authorizing Provider  acetaminophen (TYLENOL) 500 MG tablet Take 1,000 mg by mouth every 6 (six) hours as needed for mild pain.    Historical Provider, MD  gabapentin (NEURONTIN) 100 MG capsule Take 1 capsule (100 mg total) by mouth 2 (two) times daily. 11/16/13   Trixie DredgeEmily West, PA-C  ibuprofen (ADVIL,MOTRIN) 200 MG tablet Take 200 mg by mouth every 6 (six) hours as needed for mild pain.    Historical Provider, MD   BP 139/81  Pulse 66  Temp(Src) 98.3 F (36.8 C) (Oral)  Resp 18  SpO2 100% Physical Exam  Nursing note and vitals reviewed. Constitutional: He is oriented to person, place, and time. He appears well-developed and well-nourished. No distress.  HENT:  Head: Normocephalic and atraumatic.  Mouth/Throat: Oropharynx is clear and moist. No oropharyngeal exudate.  Poor dentition. Multiple missing teeth. Left upper incisor tenderness to percussion. This tooth is loose and appears to have some decay.   Mild left side facial swelling.   Eyes: Conjunctivae and EOM are normal.  Neck: Normal range of motion.  Cardiovascular: Normal rate and regular rhythm.  Exam reveals no gallop and no friction rub.   No murmur heard. Pulmonary/Chest: Effort normal and breath sounds normal. He has no wheezes. He has no rales. He exhibits no tenderness.  Musculoskeletal: Normal range of motion.  Lymphadenopathy:    He has no cervical adenopathy.  Neurological: He is alert and oriented to person, place, and time. Coordination normal.  Speech is goal-oriented. Moves limbs without ataxia.   Skin: Skin is warm and dry.  Psychiatric: He has a normal mood and affect. His behavior is normal.    ED Course  Procedures (including critical care time) Labs Review Labs Reviewed - No data to display  Imaging Review No results found.   EKG Interpretation None      MDM   Final diagnoses:  Pain, dental    10:27 AM Patient will have  Vicodin and Veetid for symptoms. Patient will have a resource guide for dental follow up. Vitals stable and patient afebrile. Patient instructed to return to the ED with worsening or concerning symptoms    Emilia BeckKaitlyn Szekalski, PA-C 11/28/13 1041

## 2013-11-28 NOTE — ED Provider Notes (Signed)
Medical screening examination/treatment/procedure(s) were performed by non-physician practitioner and as supervising physician I was immediately available for consultation/collaboration.   EKG Interpretation None        Forrest S Harrison, MD 11/28/13 1613 

## 2013-11-28 NOTE — Discharge Instructions (Signed)
Take Veetid as directed until gone. Take Vicodin for pain as needed. Refer to attached documents for more information. Follow up with the dentist from the resource guide.    Emergency Department Resource Guide 1) Find a Doctor and Pay Out of Pocket Although you won't have to find out who is covered by your insurance plan, it is a good idea to ask around and get recommendations. You will then need to call the office and see if the doctor you have chosen will accept you as a new patient and what types of options they offer for patients who are self-pay. Some doctors offer discounts or will set up payment plans for their patients who do not have insurance, but you will need to ask so you aren't surprised when you get to your appointment.  2) Contact Your Local Health Department Not all health departments have doctors that can see patients for sick visits, but many do, so it is worth a call to see if yours does. If you don't know where your local health department is, you can check in your phone book. The CDC also has a tool to help you locate your state's health department, and many state websites also have listings of all of their local health departments.  3) Find a Walk-in Clinic If your illness is not likely to be very severe or complicated, you may want to try a walk in clinic. These are popping up all over the country in pharmacies, drugstores, and shopping centers. They're usually staffed by nurse practitioners or physician assistants that have been trained to treat common illnesses and complaints. They're usually fairly quick and inexpensive. However, if you have serious medical issues or chronic medical problems, these are probably not your best option.  No Primary Care Doctor: - Call Health Connect at  [phone] - they can help you locate a primary care doctor that  accepts your insurance, provides certain services, etc. - Physician Referral Service- [phone]  Chronic Pain  Problems: Organization         Address  Phone   Notes  [facility]  [phone] Patients need to be referred by their primary care doctor.   Medication Assistance: Organization         Address  Phone   Notes  [facility] [address] [place], [place] [address] [phone] --Must be a resident of [place] -- Must have NO insurance coverage whatsoever (no Medicaid/ Medicare, etc.) -- The pt. MUST have a primary care doctor that directs their care regularly and follows them in the community   MedAssist  [phone]) [phone]   [employer]  [phone]    Agencies that provide inexpensive medical care: Organization         Address  Phone   Notes  [facility]  [phone]) [phone]   [facility]    [phone]   [facility] [address] [place], [place] [address] [phone]) [phone]   [facility] [address], [place] [phone]) [phone]   Planned Parenthood    [phone](336) [phone]   [facility]    [phone]   [facility]  [address], [place] Phone:  [phone]) [phone], Fax:  [phone] Hours of Operation:  9 am - 6 pm, M-F.  Also accepts Medicaid/Medicare and self-pay.  [facility]  [address]  [place], [place], [place] Phone: [phone], Fax: [phone]. Hours of Operation:  8:30 am - 5:30 pm, M-F.  Also accepts Medicaid and self-pay.  [facility] [place] [address], [place] Phone: [phone]   [place], [place], [place] [phone], Ext. [phone] Mondays & Thursdays: 7-9 AM.  First 15 patients are seen on a first come, first serve basis.    [place] Providers:  Organization         Address  Phone   Notes  [facility] [address], [place] [phone] Also  accepts self-pay patients.  [facility] [address], [place]  [phone]   [place], [address], [place] [phone]   [facility] [address] [place] [phone]   Lucianne Lei [address], [place]   [phone] Only accepts [place] Access [place] patients after they have their name applied to their card.   Self-Pay (no insurance) in [facility]:  Organization         Address  Phone   Notes  Sickle Cell Patients, [facility] [place] [phone]   [facility] [place] [phone]   [facility] [place]  [place], [place],  [phone]   [facility]/Dr. Osei-Bonsu  [address] [place] or [address], [place] [phone] Phone number for both [place] and [place] locations is the same.  [facility]gent [facility] [address], [place] [phone]   [facility] [address] [place] or [address] [phone]   [facility] [address], [place] [phone], phone; [phone], fax Sees patients 1st and 3rd Saturday of every month.  Must not qualify for public or private insurance (i.e. Medicaid, Medicare, [place] Health Choice, Veterans' Benefits)  Household income should be no more than 200% of the poverty level The clinic cannot treat you if you are pregnant or think you are pregnant  Sexually transmitted diseases are not treated at the clinic.    Dental Care: Organization         Address  Phone  Notes  [facility] [place] [phone] Accepts children up to age 34 who are enrolled in [place] or [place]; pregnant  women with a Medicaid card; and children who have applied for Medicaid or [place] Health Choice, but were declined, whose parents can pay a reduced fee at time of service.  [facility]  [address], [place] [phone] Accepts children up to age 24 who are enrolled in [place] or [place]; pregnant women with a Medicaid card; and children who have applied for Medicaid or [place] Health Choice, but were declined, whose parents can pay a reduced fee at time of service.  [facility]  [place] [phone] Patients are seen by appointment only. Walk-ins are not accepted. [place] will see patients 57 years of age and older. Monday - Tuesday (8am-5pm) Most Wednesdays (8:30-5pm) $30 per visit, cash only  [place]  [address], [place] [phone] Patients are seen by appointment only. Walk-ins are not accepted. [facility] will see patients 18 years of age and older. °One Wednesday Evening (Monthly: Volunteer Based).  $30 per visit, cash only  °[facility]  [phone] for adults; Children under age 4, call Graduate Pediatric Dentistry at [phone]. Children aged 4-14, please call [phone] to request a pediatric application. ° Dental services are provided in all areas of dental care including fillings, crowns and bridges, complete and partial dentures, implants, gum treatment, root canals, and extractions. Preventive care is also provided. Treatment is provided to both adults and children. °Patients are selected via a lottery and there is often a waiting list. °  °[facility] [address], °[place] ° [phone] [email or web address] °  °[facility] [address], [place], [place] [phone], Ext. [phone] Second and Fourth Thursday of each month, opens at 6:30 AM; Clinic ends at 9 AM.  Patients are  seen on a first-come first-served basis, and a limited number are seen during each clinic.  ° °[facility] ° [address], [place], [place] [phone]   Eligibility Requirements °You must have lived in [place], [place], or [place] counties for at least the last three months. °  You cannot be eligible for state or federal sponsored healthcare insurance, including Veterans Administration, Medicaid, or Medicare. °  You generally cannot be eligible for healthcare insurance through your employer.  °  How to apply: °Eligibility screenings are held every Tuesday and Wednesday afternoon from 1:00 pm until 4:00 pm. You do not need an appointment for the interview!  °[facility] [address], [place], [place] [phone]   °[facility]  [phone]   °[place] [facility]  [phone]   °[facility]  [phone]   ° °Behavioral Health Resources in the Community: °Intensive Outpatient Programs °Organization         Address  Phone  Notes  °[facility] [address], [place], [place] [phone]   °[facility] [address], [place], [place] [phone]   °[facility] [address], [place], [place] ° [phone]   °[facility] [address],  °[place], [place] 1-800-853-5163 or 336-641-4981   °Substance Abuse Resources °Organization         Address  Phone  Notes  °[facility]  336-882-2125   °[facility]  336-784-9470   °[facility]  336-285-9073   °[facility]  336-845-3988   °Residential & Outpatient Substance Abuse Program  1-800-659-3381   °Psychological Services °Organization         Address  Phone  Notes  ° Health  336- [phone]   °[facility]  336- 378-7881   °[facility] [address], [place] 1-800-853-5163 or 336-641-4981   ° °Mobile Crisis  Teams °Organization         Address  Phone  Notes  °[facility], Mobile Crisis Care Unit  1-877-626-1772   °Assertive °Psychotherapeutic Services ° [address] [place], [place] 336-834-9664   °Sharon DeEsch [address] °[place] 336-554-5454   ° °Self-Help/Support Groups °Organization         Address  Phone             Notes  °[facility] [place] -   variety of support groups  336- 336-389-6044 Call for more information  Narcotics Anonymous (NA), [facility] [address], [employer] [place]  2 meetings at this location   Residential Facilities manager         Address  Phone  Notes  [facility] [place],    [place]  1-463-457-0038   [facility]  [address] [place] [address], [place], [place]   [place], [place] [phone] Admissions: 8am-3pm M-F  [facility] [place] [address],    [place], [place] [phone]   [facility] [address] [place], [place], [place]   [facility] [address]  [place], [place]   [facility] - Intensive Outpatient [address], [place], [place]   [facility] [place] [address]  [place], [place] 1-640-386-3300 or 325-254-7062   [facility] ([facility]) [address] [place], [place] Accepts Medicaid  [facility] [place] [address],  [place] 1-646-193-9807 Substance Abuse/Addiction Treatment   [facility] Organization         Address  Phone  Notes  [facility]  [phone]   Domenic Schwab, PhD [address] [place], [place]   (925) [phone] or [phone]   [facility] [place], [place] [phone]   [facility] [address] [place], [place] [phone]  Insurance/Medicaid/sponsorship through [facility] and Families [address]                                    [place], [place] [phone] [place] [address], [place] [phone]    Dr. Adele Schilder  [phone]   [facility] Dept. 1) [address] [place] 2) [place] 3)  [place] [address], [place] [phone]  [phone]   [place] [phone] or [phone] (After Hours)

## 2013-11-28 NOTE — Progress Notes (Signed)
[facility] provided pt with a list of primary care resources and dental resources.

## 2013-11-28 NOTE — ED Notes (Signed)
Pt c/o toothache to lt side since last night.

## 2014-01-14 ENCOUNTER — Emergency Department (HOSPITAL_COMMUNITY)
Admission: EM | Admit: 2014-01-14 | Discharge: 2014-01-14 | Disposition: A | Discharge status: Home / Self Care | Payer: Medicaid Other | Attending: Emergency Medicine | Admitting: Emergency Medicine

## 2014-01-14 ENCOUNTER — Encounter (HOSPITAL_COMMUNITY): Payer: Self-pay | Admitting: Emergency Medicine

## 2014-01-14 DIAGNOSIS — K089 Disorder of teeth and supporting structures, unspecified: Secondary | ICD-10-CM | POA: Insufficient documentation

## 2014-01-14 DIAGNOSIS — Z87891 Personal history of nicotine dependence: Secondary | ICD-10-CM | POA: Diagnosis not present

## 2014-01-14 DIAGNOSIS — E119 Type 2 diabetes mellitus without complications: Secondary | ICD-10-CM | POA: Diagnosis not present

## 2014-01-14 DIAGNOSIS — Z79899 Other long term (current) drug therapy: Secondary | ICD-10-CM | POA: Diagnosis not present

## 2014-01-14 DIAGNOSIS — K029 Dental caries, unspecified: Secondary | ICD-10-CM | POA: Diagnosis not present

## 2014-01-14 DIAGNOSIS — Z792 Long term (current) use of antibiotics: Secondary | ICD-10-CM | POA: Diagnosis not present

## 2014-01-14 DIAGNOSIS — K044 Acute apical periodontitis of pulpal origin: Secondary | ICD-10-CM | POA: Diagnosis not present

## 2014-01-14 DIAGNOSIS — K047 Periapical abscess without sinus: Secondary | ICD-10-CM

## 2014-01-14 MED ORDER — PENICILLIN V POTASSIUM 500 MG PO TABS: 500.0000 mg | ORAL_TABLET | Freq: Three times a day (TID) | ORAL | Status: DC

## 2014-01-14 MED ORDER — HYDROCODONE-ACETAMINOPHEN 5-325 MG PO TABS: ORAL_TABLET | ORAL | Status: DC

## 2014-01-14 MED ORDER — NAPROXEN 500 MG PO TABS: 500.0000 mg | ORAL_TABLET | Freq: Two times a day (BID) | ORAL | Status: DC

## 2014-01-14 NOTE — ED Notes (Signed)
Pt here for dental pain, sts he got out of prison before he could have them removed for free.

## 2014-01-14 NOTE — ED Provider Notes (Signed)
CSN: [id number]     Arrival date & time 01/14/14  1240 History  This chart was scribed for non-physician practitioner, Rhea Bleacher, PA-C working with Flint Melter, MD, by Jarvis Morgan, ED Scribe. This patient was seen in room TR05C/TR05C and the patient's care was started at 1:11 PM.   Chief Complaint  Patient presents with  . Dental Pain     The history is provided by the patient. No language interpreter was used.   HPI Comments: Mike Small is a 48 y.o. male with a h/o DM w/o complication who presents to the Emergency Department complaining of constant, gradually worsening, moderate, "10/10", dental pain to two teeth to the front of his mouth. He states that he feels like his teeth need to be pulled and feels like they may be infected. He states that he got out of prison before he could have them removed. He states he is having associated swelling of his gums. He denies any drooling, fever, nausea, or emesis.    Past Medical History  Diagnosis Date  . Diabetes mellitus without complication    Past Surgical History  Procedure Laterality Date  . Knee surgery    . Ankle surgery    . Shoulder surgery     History reviewed. No pertinent family history. History  Substance Use Topics  . Smoking status: Former Games developer  . Smokeless tobacco: Not on file  . Alcohol Use: No     Review of Systems  Constitutional: Negative for fever.  HENT: Positive for dental problem (two teeth in front of his mouth. Gum swelling). Negative for drooling, ear pain, facial swelling, sore throat and trouble swallowing.   Respiratory: Negative for shortness of breath and stridor.   Gastrointestinal: Negative for nausea and vomiting.  Musculoskeletal: Negative for neck pain.  Skin: Negative for color change.  Neurological: Negative for headaches.      Allergies  Review of patient's allergies indicates no known allergies.  Home Medications   Prior to Admission medications   Medication Sig  Start Date End Date Taking? Authorizing Provider  acetaminophen (TYLENOL) 500 MG tablet Take 1,000 mg by mouth every 6 (six) hours as needed for mild pain.    Historical Provider, MD  gabapentin (NEURONTIN) 100 MG capsule Take 1 capsule (100 mg total) by mouth 2 (two) times daily. 11/16/13   Trixie Dredge, PA-C  HYDROcodone-acetaminophen (NORCO/VICODIN) 5-325 MG per tablet Take 1-2 tablets by mouth every 4 (four) hours as needed for moderate pain or severe pain. 11/28/13   Kaitlyn Szekalski, PA-C  ibuprofen (ADVIL,MOTRIN) 200 MG tablet Take 200 mg by mouth every 6 (six) hours as needed for mild pain.    Historical Provider, MD  penicillin v potassium (VEETID) 500 MG tablet Take 1 tablet (500 mg total) by mouth 4 (four) times daily. 11/28/13   Emilia Beck, PA-C   Triage Vitals: BP 155/87  Pulse 75  Temp(Src) 97.8 F (36.6 C) (Oral)  Resp 18  Ht 5\' 8"  (1.727 m)  Wt 218 lb (98.884 kg)  BMI 33.15 kg/m2  SpO2 97%  Physical Exam  Nursing note and vitals reviewed. Constitutional: He appears well-developed and well-nourished. No distress.  HENT:  Head: Normocephalic and atraumatic.  Right Ear: Tympanic membrane, external ear and ear canal normal.  Left Ear: Tympanic membrane, external ear and ear canal normal.  Nose: Nose normal.  Mouth/Throat: Uvula is midline, oropharynx is clear and moist and mucous membranes are normal. No trismus in the jaw. Abnormal dentition. Dental  caries present. No dental abscesses or uvula swelling. No tonsillar abscesses.  Patient with R/L maxillary tooth pain and tenderness to palpation in area of bilateral bicuspids. Mild swelling and erythema noted on exam. Advanced erosions of gums, advanced periodontal disease.   Eyes: Conjunctivae and EOM are normal. Pupils are equal, round, and reactive to light.  Neck: Normal range of motion. Neck supple. No tracheal deviation present.  No neck swelling or Lugwig's angina  Cardiovascular: Normal rate.   Pulmonary/Chest:  Effort normal. No respiratory distress.  Musculoskeletal: Normal range of motion.  Neurological: He is alert.  Skin: Skin is warm and dry.  Psychiatric: He has a normal mood and affect. His behavior is normal.    ED Course  Procedures (including critical care time)  DIAGNOSTIC STUDIES: Oxygen Saturation is 97% on RA, normal by my interpretation.    COORDINATION OF CARE: 1:17 PM- Will discharge pt with penicillin, Vicodin and Naprosyn and also advised him to f/u with a dentist to have his teeth extracted Pt advised of plan for treatment and pt agrees.  Labs Review Labs Reviewed - No data to display  Imaging Review No results found.   EKG Interpretation None       Vital signs reviewed and are as follows: Filed Vitals:   01/14/14 1244  BP: 155/87  Pulse: 75  Temp: 97.8 F (36.6 C)  Resp: 18    Patient counseled to take prescribed medications as directed, return with worsening facial or neck swelling, and to follow-up with their dentist as soon as possible.   Patient counseled on use of narcotic pain medications. Counseled not to combine these medications with others containing tylenol. Urged not to drink alcohol, drive, or perform any other activities that requires focus while taking these medications. The patient verbalizes understanding and agrees with the plan.   MDM   Final diagnoses:  Dental infection   Patient with toothache. No fever. Exam unconcerning for Ludwig's angina or other deep tissue infection in neck.   As there is gum swelling, erythema, will treat with antibiotic and pain medicine. Urged patient to follow-up with dentist.    I personally performed the services described in this documentation, which was scribed in my presence. The recorded information has been reviewed and is accurate.    Renne CriglerJoshua Demetrice Amstutz, PA-C 01/14/14 1322

## 2014-01-14 NOTE — Discharge Instructions (Signed)
Please read and follow all provided instructions.  Your diagnoses today include:  1. Dental infection    The exam and treatment you received today has been provided on an emergency basis only. This is not a substitute for complete medical or dental care.  Tests performed today include:  Vital signs. See below for your results today.   Medications prescribed:   Penicillin - antibiotic  You have been prescribed an antibiotic medicine: take the entire course of medicine even if you are feeling better. Stopping early can cause the antibiotic not to work.   Vicodin (hydrocodone/acetaminophen) - narcotic pain medication  DO NOT drive or perform any activities that require you to be awake and alert because this medicine can make you drowsy. BE VERY CAREFUL not to take multiple medicines containing Tylenol (also called acetaminophen). Doing so can lead to an overdose which can damage your liver and cause liver failure and possibly death.   Naproxen - anti-inflammatory pain medication  Do not exceed 500mg  naproxen every 12 hours, take with food  You have been prescribed an anti-inflammatory medication or NSAID. Take with food. Take smallest effective dose for the shortest duration needed for your pain. Stop taking if you experience stomach pain or vomiting.   Take any prescribed medications only as directed.  Home care instructions:  Follow any educational materials contained in this packet.  Follow-up instructions: Please follow-up with your dentist for further evaluation of your symptoms.   Dental Assistance: See below for dental referrals  Return instructions:   Please return to the Emergency Department if you experience worsening symptoms.  Please return if you develop a fever, you develop more swelling in your face or neck, you have trouble breathing or swallowing food.  Please return if you have any other emergent concerns.  Additional Information:  Your vital signs  today were: BP 155/87   Pulse 75   Temp(Src) 97.8 F (36.6 C) (Oral)   Resp 18   Ht 5\' 8"  (1.727 m)   Wt 218 lb (98.884 kg)   BMI 33.15 kg/m2   SpO2 97% If your blood pressure (BP) was elevated above 135/85 this visit, please have this repeated by your doctor within one month. -------------- Dental Care: Organization         Address  Phone  Notes  [facility] [address] [place], [place] [phone] Accepts children up to age 7 who are enrolled in [place] or [place] Health Choice; pregnant women with a Medicaid card; and children who have applied for Medicaid or [place] Health Choice, but were declined, whose parents can pay a reduced fee at time of service.  [facility] Department of [facility]  [address], [place] [phone] Accepts children up to age 40 who are enrolled in [place] or [place] Health Choice; pregnant women with a Medicaid card; and children who have applied for Medicaid or [place] Health Choice, but were declined, whose parents can pay a reduced fee at time of service.  [facility]  [address] [place], [place] [phone] Patients are seen by appointment only. Walk-ins are not accepted. [facility] will see patients 35 years of age and older. Monday - Tuesday (8am-5pm) Most Wednesdays (8:30-5pm) $30 per visit, cash only  [facility]  [address], [facility] [phone] Patients are seen by appointment only. Walk-ins are not accepted. [facility] will see  patients 49 years of age and older. One Wednesday Evening (Monthly: Volunteer Based).  $30 per visit, cash only  [facility]  [phone] for adults; Children under age 374, call Graduate Pediatric Dentistry at [phone]. Children aged 354-14, please call [phone] to request a pediatric application.  Dental services are provided in all areas of  dental care including fillings, crowns and bridges, complete and partial dentures, implants, gum treatment, root canals, and extractions. Preventive care is also provided. Treatment is provided to both adults and children. Patients are selected via a lottery and there is often a waiting list.   [facility] [address], [place]  [phone] [email or web address]   [facility] [address], [place], [place] [phone], [phone]. [phone] Second and Fourth Thursday of each month, opens at 6:30 AM; Clinic ends at 9 AM.  Patients are seen on a first-come first-served basis, and a limited number are seen during each clinic.   [facility]  [address], [place], [place] [phone]   Eligibility Requirements You must have lived in [place], [place], or [place] counties for at least the last three months.   You cannot be eligible for state or federal sponsored [place] insurance, including [employer], [place], or [employer]icare.   You generally cannot be eligible for healthcare insurance through your employer.    How to apply: Eligibility screenings are held every Tuesday and Wednesday afternoon from 1:00 pm until 4:00 pm. You do not need an appointment for the interview!  [facility] [address], [place], [place] [phone]   [facility]  [phone]   [facility]  [phone]   [facility]  [phone]

## 2014-01-14 NOTE — ED Notes (Signed)
Declined W/C at D/C and was escorted to lobby by RN. 

## 2014-01-14 NOTE — ED Provider Notes (Signed)
Medical screening examination/treatment/procedure(s) were performed by non-physician practitioner and as supervising physician I was immediately available for consultation/collaboration.  Temeka Pore L Nirvana Blanchett, MD 01/14/14 1639 

## 2014-02-11 ENCOUNTER — Other Ambulatory Visit (HOSPITAL_COMMUNITY): Payer: Self-pay | Admitting: Pain Medicine

## 2014-02-11 ENCOUNTER — Ambulatory Visit (HOSPITAL_COMMUNITY)
Admission: RE | Admit: 2014-02-11 | Discharge: 2014-02-11 | Disposition: A | Discharge status: Home / Self Care | Payer: Medicaid Other | Source: Ambulatory Visit | Attending: Pain Medicine | Admitting: Pain Medicine

## 2014-02-11 ENCOUNTER — Emergency Department (HOSPITAL_COMMUNITY)
Admission: EM | Admit: 2014-02-11 | Discharge: 2014-02-11 | Discharge status: Against Medical Advice | Payer: Medicaid Other

## 2014-02-11 DIAGNOSIS — R52 Pain, unspecified: Secondary | ICD-10-CM

## 2014-02-11 DIAGNOSIS — M533 Sacrococcygeal disorders, not elsewhere classified: Secondary | ICD-10-CM

## 2014-02-11 DIAGNOSIS — G8929 Other chronic pain: Secondary | ICD-10-CM | POA: Diagnosis not present

## 2014-02-11 DIAGNOSIS — M25569 Pain in unspecified knee: Secondary | ICD-10-CM | POA: Insufficient documentation

## 2014-03-04 ENCOUNTER — Other Ambulatory Visit (HOSPITAL_COMMUNITY): Payer: Self-pay | Admitting: Pain Medicine

## 2014-03-04 ENCOUNTER — Ambulatory Visit (HOSPITAL_COMMUNITY)
Admission: RE | Admit: 2014-03-04 | Discharge: 2014-03-04 | Disposition: A | Discharge status: Home / Self Care | Payer: Medicaid Other | Source: Ambulatory Visit | Attending: Pain Medicine | Admitting: Pain Medicine

## 2014-03-04 ENCOUNTER — Encounter (INDEPENDENT_AMBULATORY_CARE_PROVIDER_SITE_OTHER): Payer: Self-pay

## 2014-03-04 DIAGNOSIS — M79609 Pain in unspecified limb: Secondary | ICD-10-CM | POA: Insufficient documentation

## 2014-03-04 DIAGNOSIS — G8929 Other chronic pain: Secondary | ICD-10-CM | POA: Diagnosis not present

## 2014-03-04 DIAGNOSIS — R52 Pain, unspecified: Secondary | ICD-10-CM

## 2014-03-04 DIAGNOSIS — M2140 Flat foot [pes planus] (acquired), unspecified foot: Secondary | ICD-10-CM | POA: Insufficient documentation

## 2014-03-04 DIAGNOSIS — M25579 Pain in unspecified ankle and joints of unspecified foot: Secondary | ICD-10-CM | POA: Insufficient documentation

## 2014-03-21 ENCOUNTER — Other Ambulatory Visit: Payer: Self-pay | Admitting: Pain Medicine

## 2014-03-21 DIAGNOSIS — M1651 Unilateral post-traumatic osteoarthritis, right hip: Secondary | ICD-10-CM

## 2014-03-21 DIAGNOSIS — M25552 Pain in left hip: Secondary | ICD-10-CM

## 2014-03-21 DIAGNOSIS — M1652 Unilateral post-traumatic osteoarthritis, left hip: Secondary | ICD-10-CM

## 2014-03-21 DIAGNOSIS — M25551 Pain in right hip: Secondary | ICD-10-CM

## 2014-03-29 ENCOUNTER — Ambulatory Visit
Admission: RE | Admit: 2014-03-29 | Discharge: 2014-03-29 | Disposition: A | Discharge status: Home / Self Care | Payer: Medicaid Other | Source: Ambulatory Visit | Attending: Pain Medicine | Admitting: Pain Medicine

## 2014-03-29 DIAGNOSIS — M25552 Pain in left hip: Secondary | ICD-10-CM

## 2014-03-29 DIAGNOSIS — M1651 Unilateral post-traumatic osteoarthritis, right hip: Secondary | ICD-10-CM

## 2014-03-29 DIAGNOSIS — M1652 Unilateral post-traumatic osteoarthritis, left hip: Secondary | ICD-10-CM

## 2014-03-29 DIAGNOSIS — M25551 Pain in right hip: Secondary | ICD-10-CM

## 2015-01-04 ENCOUNTER — Encounter (HOSPITAL_COMMUNITY): Payer: Self-pay | Admitting: *Deleted

## 2015-01-04 ENCOUNTER — Emergency Department (HOSPITAL_COMMUNITY)
Admission: EM | Admit: 2015-01-04 | Discharge: 2015-01-04 | Disposition: A | Discharge status: Home / Self Care | Payer: Medicaid Other | Attending: Emergency Medicine | Admitting: Emergency Medicine

## 2015-01-04 ENCOUNTER — Emergency Department (HOSPITAL_COMMUNITY): Payer: Medicaid Other

## 2015-01-04 DIAGNOSIS — Y9234 Swimming pool (public) as the place of occurrence of the external cause: Secondary | ICD-10-CM | POA: Insufficient documentation

## 2015-01-04 DIAGNOSIS — S93401A Sprain of unspecified ligament of right ankle, initial encounter: Secondary | ICD-10-CM

## 2015-01-04 DIAGNOSIS — Y998 Other external cause status: Secondary | ICD-10-CM | POA: Diagnosis not present

## 2015-01-04 DIAGNOSIS — E119 Type 2 diabetes mellitus without complications: Secondary | ICD-10-CM | POA: Diagnosis not present

## 2015-01-04 DIAGNOSIS — X58XXXA Exposure to other specified factors, initial encounter: Secondary | ICD-10-CM | POA: Diagnosis not present

## 2015-01-04 DIAGNOSIS — Z87891 Personal history of nicotine dependence: Secondary | ICD-10-CM | POA: Diagnosis not present

## 2015-01-04 DIAGNOSIS — Z792 Long term (current) use of antibiotics: Secondary | ICD-10-CM | POA: Insufficient documentation

## 2015-01-04 DIAGNOSIS — S99911A Unspecified injury of right ankle, initial encounter: Secondary | ICD-10-CM | POA: Diagnosis present

## 2015-01-04 DIAGNOSIS — Y9389 Activity, other specified: Secondary | ICD-10-CM | POA: Diagnosis not present

## 2015-01-04 DIAGNOSIS — Z791 Long term (current) use of non-steroidal anti-inflammatories (NSAID): Secondary | ICD-10-CM | POA: Diagnosis not present

## 2015-01-04 DIAGNOSIS — Z79899 Other long term (current) drug therapy: Secondary | ICD-10-CM | POA: Insufficient documentation

## 2015-01-04 MED ORDER — HYDROCODONE-ACETAMINOPHEN 5-325 MG PO TABS: ORAL_TABLET | ORAL | Status: DC

## 2015-01-04 MED ORDER — NAPROXEN 500 MG PO TABS: 500.0000 mg | ORAL_TABLET | Freq: Two times a day (BID) | ORAL | Status: DC

## 2015-01-04 NOTE — Discharge Instructions (Signed)
Please read and follow all provided instructions.  Your diagnoses today include:  1. Ankle sprain, right, initial encounter    Tests performed today include:  An x-ray of your ankle - does NOT show any broken bones, hardware is stable  Vital signs. See below for your results today.   Medications prescribed:   Vicodin (hydrocodone/acetaminophen) - narcotic pain medication  DO NOT drive or perform any activities that require you to be awake and alert because this medicine can make you drowsy. BE VERY CAREFUL not to take multiple medicines containing Tylenol (also called acetaminophen). Doing so can lead to an overdose which can damage your liver and cause liver failure and possibly death.   Naproxen - anti-inflammatory pain medication  Do not exceed  naproxen every 12 hours, take with food  You have been prescribed an anti-inflammatory medication or NSAID. Take with food. Take smallest effective dose for the shortest duration needed for your pain. Stop taking if you experience stomach pain or vomiting.   Take any prescribed medications only as directed.  Home care instructions:   Follow any educational materials contained in this packet  Follow R.I.C.E. Protocol:  R - rest your injury   I  - use ice on injury without applying directly to skin  C - compress injury with bandage or splint  E - elevate the injury as much as possible  Follow-up instructions: Please follow-up with your primary care provider or your orthopedic (bone specialist) if you continue to have significant pain or trouble walking in 1 week. In this case you may have a severe sprain that requires further care.   Return instructions:   Please return if your toes are numb or tingling, appear gray or blue, or you have severe pain (also elevate leg and loosen splint or wrap)  Please return to the Emergency Department if you experience worsening symptoms.   Please return if you have any other emergent  concerns.  Additional Information:  Your vital signs today were: BP 119/74 mmHg   Pulse 64   Temp(Src) 98 F (36.7 C) (Oral)   Resp 19   Ht  (1.727 m)   Wt 214 lb (97.07 kg)   BMI 32.55 kg/m2   SpO2 99% If your blood pressure (BP) was elevated above 135/85 this visit, please have this repeated by your doctor within one month. -------------- Your caregiver has diagnosed you as suffering from an ankle sprain. Ankle sprain occurs when the ligaments that hold the ankle joint together are stretched or torn. It may take 4 to 6 weeks to heal.  For Activity: If prescribed crutches, use crutches with non-weight bearing for the first few days. Then, you may walk on your ankle as the pain allows, or as instructed. Start gradually with weight bearing on the affected ankle. Once you can walk pain free, then try jogging. When you can run forwards, then you can try moving side-to-side. If you cannot walk without crutches in one week, you need a re-check. --------------

## 2015-01-04 NOTE — ED Provider Notes (Signed)
CSN: [id number]     Arrival date & time 01/04/15  7829 History   First MD Initiated Contact with Patient 01/04/15 [phone]     No chief complaint on file.    (Consider location/radiation/quality/duration/timing/severity/associated sxs/prior Treatment) HPI Comments: Patient with history of surgery on his right ankle presents with complaint of ankle pain which started this morning. Patient denies specific injury but states that he was very active in a swimming pool yesterday and thinks he overdid it on his ankle. Patient states that he was able to walk home from the swimming pool without difficulty yesterday. Patient has occasional pain since his previous surgery. No numbness or tingling. No knee pain. No other injuries. No treatments prior to arrival. Onset of symptoms acute. Course is constant. Symptoms worse with ambulation and palpation.  The history is provided by the patient.    Past Medical History  Diagnosis Date  . Diabetes mellitus without complication    Past Surgical History  Procedure Laterality Date  . Knee surgery    . Ankle surgery    . Shoulder surgery     No family history on file. History  Substance Use Topics  . Smoking status: Former Games developer  . Smokeless tobacco: Not on file  . Alcohol Use: No    Review of Systems  Constitutional: Negative for activity change.  Musculoskeletal: Positive for arthralgias and gait problem. Negative for back pain, joint swelling and neck pain.  Skin: Negative for wound.  Neurological: Negative for weakness and numbness.      Allergies  Review of patient's allergies indicates no known allergies.  Home Medications   Prior to Admission medications   Medication Sig Start Date End Date Taking? Authorizing Provider  acetaminophen (TYLENOL) 500 MG tablet Take 1,000 mg by mouth every 6 (six) hours as needed for mild pain.    Historical Provider, MD  gabapentin (NEURONTIN) 100 MG capsule Take 1 capsule (100 mg total) by mouth 2 (two)  times daily. 11/16/13   Trixie Dredge, PA-C  HYDROcodone-acetaminophen (NORCO/VICODIN) 5-325 MG per tablet Take 1-2 tablets every 6 hours as needed for severe pain 01/14/14   Renne Crigler, PA-C  ibuprofen (ADVIL,MOTRIN) 200 MG tablet Take 200 mg by mouth every 6 (six) hours as needed for mild pain.    Historical Provider, MD  naproxen (NAPROSYN) 500 MG tablet Take 1 tablet (500 mg total) by mouth 2 (two) times daily. 01/14/14   Renne Crigler, PA-C  penicillin v potassium (VEETID) 500 MG tablet Take 1 tablet (500 mg total) by mouth 3 (three) times daily. 01/14/14   Renne Crigler, PA-C   BP 119/74 mmHg  Pulse 64  Temp(Src) 98 F (36.7 C) (Oral)  Resp 19  Ht  (1.727 m)  Wt 215 lb (97.523 kg)  BMI 32.70 kg/m2  SpO2 99% Physical Exam  Constitutional: He appears well-developed and well-nourished.  HENT:  Head: Normocephalic and atraumatic.  Eyes: Conjunctivae are normal.  Neck: Normal range of motion. Neck supple.  Cardiovascular:  Pulses:      Dorsalis pedis pulses are 2+ on the right side, and 2+ on the left side.       Posterior tibial pulses are 2+ on the right side, and 2+ on the left side.  Pulmonary/Chest: No respiratory distress.  Musculoskeletal: He exhibits edema and tenderness.  Patient complains of pain with palpation of the medial/lateral right ankle. He denies pain with palpation over the fibular head of the affected side. He denies pain in the hip of  the affected side.  Neurological: He is alert.  Distal motor, sensation, and vascular intact.  Skin: Skin is warm and dry.  Psychiatric: He has a normal mood and affect.  Vitals reviewed.   ED Course  Procedures (including critical care time) Labs Review Labs Reviewed - No data to display  Imaging Review Dg Ankle Complete Right  01/04/2015   CLINICAL DATA:  Twisting injury of right ankle yesterday with ankle pain. History of prior surgery for ankle fractures. Initial encounter.  EXAM: RIGHT ANKLE - COMPLETE 3+ VIEW   COMPARISON:  03/04/2014  FINDINGS: No acute fractures identified. The ankle mortise shows normal alignment. Mild degenerative changes are seen involving the medial ankle mortise. Stable positioning and appearance of hardware after fixation of the distal fibula and medial malleolus. No hardware failure or abnormal lucency surrounding hardware identified. Soft tissues are unremarkable.  IMPRESSION: No acute fracture. Mild degenerative changes of the medial ankle. Fixation hardware appears stable after prior ORIF.   Electronically Signed   By: Irish Lack M.D.   On: 01/04/2015 11:13     EKG Interpretation None       10:00 AM Patient seen and examined. Work-up initiated.   Vital signs reviewed and are as follows: BP 119/74 mmHg  Pulse 64  Temp(Src) 98 F (36.7 C) (Oral)  Resp 19  Ht 5\' 8"  (1.727 m)  Wt 215 lb (97.523 kg)  BMI 32.70 kg/m2  SpO2 99%  Patient updated on x-ray results. X-ray results reviewed by myself. Patient given ASO. He has crutches.  Patient was counseled on RICE protocol and told to rest injury, use ice for no longer than 15 minutes every hour, compress the area, and elevate above the level of their heart as much as possible to reduce swelling. Questions answered. Patient verbalized understanding.    Patient counseled on use of narcotic pain medications. Counseled not to combine these medications with others containing tylenol. Urged not to drink alcohol, drive, or perform any other activities that requires focus while taking these medications. The patient verbalizes understanding and agrees with the plan.  Encouraged PCP follow-up if he continues to have significant pain or trouble walking one week as this may indicate more severe injury.  MDM   Final diagnoses:  Ankle sprain, right, initial encounter   Patient with ankle injury, negative x-rays. Will treat as sprain. PCP follow-up as needed. Lower extremity is neurovascularly intact. Do not suspect compartment  syndrome or DVT.   Renne Crigler, PA-C 01/04/15 1233  Gwyneth Sprout, MD 01/04/15 1630

## 2015-01-04 NOTE — ED Notes (Signed)
Patient transported to X-ray 

## 2015-01-04 NOTE — ED Notes (Signed)
Pt states he twisted his right ankle yesterday, and woke up this morning with pain.

## 2015-01-04 NOTE — ED Notes (Signed)
Pt is in stable condition upon d/c and is escorted via wheelchair from ED. 

## 2016-10-25 ENCOUNTER — Emergency Department (HOSPITAL_COMMUNITY): Payer: Medicaid Other

## 2016-10-25 ENCOUNTER — Encounter (HOSPITAL_COMMUNITY): Payer: Self-pay | Admitting: Emergency Medicine

## 2016-10-25 ENCOUNTER — Emergency Department (HOSPITAL_COMMUNITY)
Admission: EM | Admit: 2016-10-25 | Discharge: 2016-10-25 | Disposition: A | Discharge status: Home / Self Care | Payer: Medicaid Other | Attending: Emergency Medicine | Admitting: Emergency Medicine

## 2016-10-25 DIAGNOSIS — E119 Type 2 diabetes mellitus without complications: Secondary | ICD-10-CM | POA: Diagnosis not present

## 2016-10-25 DIAGNOSIS — M75101 Unspecified rotator cuff tear or rupture of right shoulder, not specified as traumatic: Secondary | ICD-10-CM | POA: Diagnosis not present

## 2016-10-25 DIAGNOSIS — M25511 Pain in right shoulder: Secondary | ICD-10-CM | POA: Diagnosis present

## 2016-10-25 DIAGNOSIS — M7581 Other shoulder lesions, right shoulder: Secondary | ICD-10-CM

## 2016-10-25 DIAGNOSIS — Z87891 Personal history of nicotine dependence: Secondary | ICD-10-CM | POA: Insufficient documentation

## 2016-10-25 MED ORDER — IBUPROFEN 600 MG PO TABS: 600.0000 mg | ORAL_TABLET | Freq: Four times a day (QID) | ORAL | 0 refills | Status: DC | PRN

## 2016-10-25 NOTE — ED Provider Notes (Signed)
[facility]-EMERGENCY DEPT Provider Note  By signing my name below, I, Mike Small, attest that this documentation has been prepared under the direction and in the presence of Derwood KaplanNanavati, Safiyyah Vasconez, MD . Electronically Signed: Phillips ClimesFabiola de Small, Scribe. 10/25/2016. 1:10 PM.  History   Chief Complaint Chief Complaint  Patient presents with  . Shoulder Pain   The history is provided by the patient and medical records. No language interpreter was used.    Mike KinnierDaniel L Small is a 51 y.o. male with PMHx of DM who presents to the Emergency Department complaining of R shoulder pain x1 month. Pt with no recent injury or trauma.  Pt is R handed and sx are interfering with his daily activities. Sx not relieved by Tylenol, ASA or muscle relaxer's.   He denies any other acute sx, including nausea, vomiting, fever or headache.   Past Medical History:  Diagnosis Date  . Diabetes mellitus without complication (HCC)    There are no active problems to display for this patient.   Past Surgical History:  Procedure Laterality Date  . ANKLE SURGERY    . KNEE SURGERY    . SHOULDER SURGERY         Home Medications    Prior to Admission medications   Medication Sig Start Date End Date Taking? Authorizing Provider  acetaminophen (TYLENOL) 500 MG tablet Take 1,000 mg by mouth every 6 (six) hours as needed for mild pain.    [provider]  gabapentin (NEURONTIN) 100 MG capsule Take 1 capsule (100 mg total) by mouth 2 (two) times daily. 11/16/13   Trixie DredgeWest, Emily, PA-C  HYDROcodone-acetaminophen (NORCO/VICODIN) 5-325 MG per tablet Take 1-2 tablets every 6 hours as needed for severe pain 01/04/15   Renne CriglerGeiple, Joshua, PA-C  ibuprofen (ADVIL,MOTRIN) 600 MG tablet Take 1 tablet (600 mg total) by mouth every 6 (six) hours as needed. 10/25/16   Derwood KaplanNanavati, Reva Pinkley, MD  naproxen (NAPROSYN) 500 MG tablet Take 1 tablet (500 mg total) by mouth 2 (two) times daily. 01/04/15   Renne CriglerGeiple, Joshua, PA-C  penicillin v potassium (VEETID)  500 MG tablet Take 1 tablet (500 mg total) by mouth 3 (three) times daily. 01/14/14   Renne CriglerGeiple, Joshua, PA-C    Family History No family history on file.  Social History Social History  Substance Use Topics  . Smoking status: Former Games developermoker  . Smokeless tobacco: Not on file  . Alcohol use No     Allergies   Patient has no known allergies.   Review of Systems Review of Systems  Constitutional: Negative for fever.  Gastrointestinal: Negative for abdominal pain, nausea and vomiting.  Musculoskeletal: Positive for arthralgias and joint swelling.   Physical Exam Updated Vital Signs BP 136/90   Pulse (!) 57   Temp 98.7 F (37.1 C)   Resp 13   SpO2 100%   Physical Exam  Constitutional: He is oriented to person, place, and time. He appears well-developed and well-nourished.  HENT:  Head: Normocephalic and atraumatic.  Neck: Normal range of motion.  Cardiovascular: Normal rate and intact distal pulses.   Pulmonary/Chest: Effort normal.  Musculoskeletal:  Gross edema over anterior aspect of R side of shoulder. Tenderness with internal rotation.  Tenderness with abduction of the shoulder, which gets worse as the shoulder goes above the plane of the shoulder.  Neurological: He is alert and oriented to person, place, and time.  Skin: Skin is warm and dry.  Psychiatric: He has a normal mood and affect. His behavior is normal.  Nursing note and vitals reviewed.   ED Treatments / Results  DIAGNOSTIC STUDIES: Oxygen Saturation is 100% on RA, normal by my interpretation.   COORDINATION OF CARE: 1:09 PM- Pt verbalizes understanding and agrees to plan. Pt has medicaid and agrees to f/u with an orthopedist.   Medications - No data to display  Labs (all labs ordered are listed, but only abnormal results are displayed) Labs Reviewed - No data to display  EKG  EKG Interpretation None       Radiology Dg Shoulder Right  Result Date: 10/25/2016 CLINICAL DATA:  Right shoulder  pain EXAM: RIGHT SHOULDER - 2+ VIEW COMPARISON:  None FINDINGS: There is no evidence of fracture or dislocation. There is no evidence of arthropathy or other focal bone abnormality. Soft tissues are unremarkable. IMPRESSION: Negative. Electronically Signed   By: Signa Kell M.D.   On: 10/25/2016 10:23    Procedures Procedures (including critical care time)  Medications Ordered in ED Medications - No data to display   Initial Impression / Assessment and Plan / ED Course  I have reviewed the triage vital signs and the nursing notes.  Pertinent labs & imaging results that were available during my care of the patient were reviewed by me and considered in my medical decision making (see chart for details).     Pt has a rotator cuff tear vs. Impingement syndrome. There is some swelling noted over the [facility] joint. Xrays from triage neg, will get Ortho f/u.  Final Clinical Impressions(s) / ED Diagnoses   Final diagnoses:  Rotator cuff tendinitis, right    New Prescriptions New Prescriptions   IBUPROFEN (ADVIL,MOTRIN) 600 MG TABLET    Take 1 tablet (600 mg total) by mouth every 6 (six) hours as needed.   I personally performed the services described in this documentation, which was scribed in my presence. The recorded information has been reviewed and is accurate.     Derwood Kaplan, MD 10/25/16 [phone]

## 2016-10-25 NOTE — ED Triage Notes (Signed)
Pt reports right shoulder pain x1 month, denies any known injury. Has taken tylenol and muscle relaxers with no relief.

## 2016-10-25 NOTE — Discharge Instructions (Signed)
Ice the shoulder. Please perform the exercises listed. See the Ortho doctor soon.

## 2017-07-09 ENCOUNTER — Encounter (HOSPITAL_COMMUNITY): Payer: Self-pay | Admitting: Emergency Medicine

## 2017-07-09 ENCOUNTER — Other Ambulatory Visit: Payer: Self-pay

## 2017-07-09 ENCOUNTER — Emergency Department (HOSPITAL_COMMUNITY)
Admission: EM | Admit: 2017-07-09 | Discharge: 2017-07-09 | Disposition: A | Discharge status: Home / Self Care | Payer: Medicaid Other | Attending: Emergency Medicine | Admitting: Emergency Medicine

## 2017-07-09 DIAGNOSIS — E1165 Type 2 diabetes mellitus with hyperglycemia: Secondary | ICD-10-CM | POA: Insufficient documentation

## 2017-07-09 DIAGNOSIS — Z794 Long term (current) use of insulin: Secondary | ICD-10-CM | POA: Diagnosis not present

## 2017-07-09 DIAGNOSIS — Z720 Tobacco use: Secondary | ICD-10-CM | POA: Diagnosis not present

## 2017-07-09 DIAGNOSIS — R631 Polydipsia: Secondary | ICD-10-CM | POA: Diagnosis present

## 2017-07-09 DIAGNOSIS — R739 Hyperglycemia, unspecified: Secondary | ICD-10-CM

## 2017-07-09 DIAGNOSIS — Z9114 Patient's other noncompliance with medication regimen: Secondary | ICD-10-CM | POA: Diagnosis not present

## 2017-07-09 LAB — I-STAT VENOUS BLOOD GAS, ED
Acid-base deficit: 2 mmol/L (ref 0.0–2.0)
Bicarbonate: 22.4 mmol/L (ref 20.0–28.0)
O2 Saturation: 80 %
PH VEN: 7.41 (ref 7.250–7.430)
TCO2: 23 mmol/L (ref 22–32)
pCO2, Ven: 35.4 mmHg — ABNORMAL LOW (ref 44.0–60.0)
pO2, Ven: 43 mmHg (ref 32.0–45.0)

## 2017-07-09 LAB — URINALYSIS, ROUTINE W REFLEX MICROSCOPIC
BACTERIA UA: NONE SEEN
Bilirubin Urine: NEGATIVE
Glucose, UA: >=500 mg/dL — AB
Hgb urine dipstick: NEGATIVE
Ketones, ur: NEGATIVE mg/dL
Leukocytes, UA: NEGATIVE
NITRITE: NEGATIVE
PH: 7 (ref 5.0–8.0)
Protein, ur: NEGATIVE mg/dL
SPECIFIC GRAVITY, URINE: 1.013 (ref 1.005–1.030)

## 2017-07-09 LAB — BASIC METABOLIC PANEL
Anion gap: 12 (ref 5–15)
BUN: 11 mg/dL (ref 6–20)
CO2: 20 mmol/L — ABNORMAL LOW (ref 22–32)
Calcium: 9.5 mg/dL (ref 8.9–10.3)
Chloride: 99 mmol/L — ABNORMAL LOW (ref 101–111)
Creatinine, Ser: 1.18 mg/dL (ref 0.61–1.24)
GFR calc Af Amer: >60 mL/min (ref >60)
GLUCOSE: 490 mg/dL — AB (ref 65–99)
POTASSIUM: 4 mmol/L (ref 3.5–5.1)
Sodium: 131 mmol/L — ABNORMAL LOW (ref 135–145)

## 2017-07-09 LAB — CBC
HEMATOCRIT: 43.9 % (ref 39.0–52.0)
Hemoglobin: 15.8 g/dL (ref 13.0–17.0)
MCH: 33.7 pg (ref 26.0–34.0)
MCHC: 36 g/dL (ref 30.0–36.0)
MCV: 93.6 fL (ref 78.0–100.0)
Platelets: 225 10*3/uL (ref 150–400)
RBC: 4.69 MIL/uL (ref 4.22–5.81)
RDW: 12.4 % (ref 11.5–15.5)
WBC: 8.4 10*3/uL (ref 4.0–10.5)

## 2017-07-09 LAB — CBG MONITORING, ED
GLUCOSE-CAPILLARY: 449 mg/dL — AB (ref 65–99)
Glucose-Capillary: 375 mg/dL — ABNORMAL HIGH (ref 65–99)

## 2017-07-09 MED ORDER — SODIUM CHLORIDE 0.9 % IV BOLUS (SEPSIS)
1000.0000 mL | Freq: Once | INTRAVENOUS | Status: AC
  Administered 2017-07-09: 1000 mL via INTRAVENOUS

## 2017-07-09 NOTE — ED Notes (Signed)
Attempted IV access x1 Pt stated "IV team usually has to come"

## 2017-07-09 NOTE — ED Triage Notes (Signed)
C/o hyperglycemia, increased urination, and blurred vision x 1 week.  Pt states glucometer has been giving an error message for 1 week so he is unable to take his medication because unsure of amount to take.

## 2017-07-09 NOTE — ED Provider Notes (Signed)
[facility] EMERGENCY DEPARTMENT Provider Note   CSN: [id number] Arrival date & time: 07/09/17  0256     History   Chief Complaint Chief Complaint  Patient presents with  . Hyperglycemia    HPI Mike Small is a 52 y.o. male.  HPI [name] past medical history significant for diabetes presents to the emergency department today for evaluation of hyperglycemia.  Patient states that he has been not able to take his blood sugar for the past 3 weeks because his meter is broken.  Patient states that he has not taken his insulin for the past 3 weeks.  Patient reports that his blood sugars have been running high.  States that he reports increased polyuria and polydipsia.  Patient also reports some intermittent blurry vision that is typical when his blood sugars get high.  Denies any vision changes at this time.  Denies any associated headache, lightheadedness or dizziness.  Patient reports generalized abdominal pain but no nausea or emesis.  Denies any change in bowel habits or urinary symptoms.  Nothing makes his symptoms better or worse.  He is not taking for symptoms prior to arrival.  Pt denies any fever, chill, ha, lightheadedness, dizziness, congestion, neck pain, cp, sob, cough,  n/v/d, urinary symptoms, change in bowel habits, melena, hematochezia, lower extremity paresthesias.   Past Medical History:  Diagnosis Date  . Diabetes mellitus without complication (HCC)     There are no active problems to display for this patient.   Past Surgical History:  Procedure Laterality Date  . ANKLE SURGERY    . KNEE SURGERY    . SHOULDER SURGERY         Home Medications    Prior to Admission medications   Medication Sig Start Date End Date Taking? Authorizing Provider  acetaminophen (TYLENOL) 500 MG tablet Take 1,000 mg by mouth every 6 (six) hours as needed for mild pain.    [provider]  gabapentin (NEURONTIN) 100 MG  capsule Take 1 capsule (100 mg total) by mouth 2 (two) times daily. 11/16/13   Trixie Dredge, PA-C  HYDROcodone-acetaminophen (NORCO/VICODIN) 5-325 MG per tablet Take 1-2 tablets every 6 hours as needed for severe pain 01/04/15   Renne Crigler, PA-C  ibuprofen (ADVIL,MOTRIN) 600 MG tablet Take 1 tablet (600 mg total) by mouth every 6 (six) hours as needed. 10/25/16   Derwood Kaplan, MD  naproxen (NAPROSYN) 500 MG tablet Take 1 tablet (500 mg total) by mouth 2 (two) times daily. 01/04/15   Renne Crigler, PA-C  penicillin v potassium (VEETID) 500 MG tablet Take 1 tablet (500 mg total) by mouth 3 (three) times daily. 01/14/14   Renne Crigler, PA-C    Family History No family history on file.  Social History Social History   Tobacco Use  . Smoking status: Former Games developer  . Smokeless tobacco: Never Used  Substance Use Topics  . Alcohol use: No  . Drug use: No     Allergies   Patient has no known allergies.   Review of Systems Review of Systems  Constitutional: Negative for chills and fever.  HENT: Negative for congestion and sore throat.   Eyes: Positive for visual disturbance (none at this time).  Respiratory: Negative for cough and shortness of breath.   Cardiovascular: Negative for chest pain.  Gastrointestinal: Positive for abdominal pain (generalizied). Negative for diarrhea, nausea and vomiting.  Endocrine: Positive for polydipsia and polyuria.  Genitourinary: Negative for dysuria, flank pain, frequency, hematuria, scrotal swelling,  testicular pain and urgency.  Musculoskeletal: Negative for arthralgias and myalgias.  Skin: Negative for rash.  Neurological: Negative for dizziness, syncope, weakness, light-headedness, numbness and headaches.  Psychiatric/Behavioral: Negative for sleep disturbance. The patient is not nervous/anxious.      Physical Exam Updated Vital Signs BP 131/77   Pulse (!) 56   Temp 98.7 F (37.1 C) (Oral)   Resp 18   SpO2 97%   Physical Exam    Constitutional: He is oriented to person, place, and time. He appears well-developed and well-nourished.  Non-toxic appearance. No distress.  HENT:  Head: Normocephalic and atraumatic.  Mouth/Throat: Oropharynx is clear and moist.  Mucous membranes moist.  Eyes: Conjunctivae are normal. Pupils are equal, round, and reactive to light. Right eye exhibits no discharge. Left eye exhibits no discharge.  Neck: Normal range of motion. Neck supple.  Cardiovascular: Normal rate, regular rhythm, normal heart sounds and intact distal pulses. Exam reveals no gallop and no friction rub.  No murmur heard. Pulmonary/Chest: Effort normal and breath sounds normal. No stridor. No respiratory distress. He has no wheezes. He has no rales. He exhibits no tenderness.  Abdominal: Soft. Bowel sounds are normal. He exhibits no distension. There is no tenderness. There is no rigidity, no rebound, no guarding, no CVA tenderness, no tenderness at McBurney's point and negative Murphy's sign.  Musculoskeletal: Normal range of motion. He exhibits no tenderness.  Lymphadenopathy:    He has no cervical adenopathy.  Neurological: He is alert and oriented to person, place, and time.  Skin: Skin is warm and dry. Capillary refill takes less than 2 seconds. No rash noted.  Good skin turgor.  Psychiatric: His behavior is normal. Judgment and thought content normal.  Nursing note and vitals reviewed.    ED Treatments / Results  Labs (all labs ordered are listed, but only abnormal results are displayed) Labs Reviewed  BASIC METABOLIC PANEL - Abnormal; Notable for the following components:      Result Value   Sodium 131 (*)    Chloride 99 (*)    CO2 20 (*)    Glucose, Bld 490 (*)    All other components within normal limits  URINALYSIS, ROUTINE W REFLEX MICROSCOPIC - Abnormal; Notable for the following components:   Color, Urine COLORLESS (*)    Glucose, UA >=500 (*)    Squamous Epithelial / LPF 0-5 (*)    All other  components within normal limits  CBG MONITORING, ED - Abnormal; Notable for the following components:   Glucose-Capillary 449 (*)    All other components within normal limits  CBC  CBG MONITORING, ED    EKG  EKG Interpretation None       Radiology No results found.  Procedures Procedures (including critical care time)  Medications Ordered in ED Medications  sodium chloride 0.9 % bolus 1,000 mL (not administered)     Initial Impression / Assessment and Plan / ED Course  I have reviewed the triage vital signs and the nursing notes.  Pertinent labs & imaging results that were available during my care of the patient were reviewed by me and considered in my medical decision making (see chart for details).     Patient presents to the emergency department today with complaints of hyperglycemia.  He has not been taking his insulin correctly for the past 3 weeks because his blood sugar meter has been broken.  Patient reports generalized abdominal cramping and body aches along with polyuria and polydipsia.  Denies any other  associated symptoms.  Patient is overall well-appearing and nontoxic.  Vital signs are reassuring.  Patient is afebrile in the ED.  No tachycardia or hypotension is noted.  Lungs clear to auscultation bilaterally.  No focal abdominal tenderness on palpation.  No signs of peritonitis.  Heart regular rate and rhythm with no rubs murmurs or gallops.  Blood work is reassuring.  No leukocytosis.  Patient does have a hyperglycemia at 490.  Corrected sodium is normal.  Kidney function is normal.  UA shows no signs of infection or ketones.  Anion gap is normal.  Patient's pH is without acidosis.  No signs of DKA.  Patient given fluids in the ED.  Blood sugar has improved to 350.  Again no signs of DKA or further need for intervention at this time.  Feel the patient can be discharged home with his home insulin as prescribed.  Patient feels much improved at fluids and is ready  for discharge at this time.  Did speak with case manager who has given patient information about obtaining a glucose meter from pharmacy.  Pt is hemodynamically stable, in NAD, & able to ambulate in the ED. Evaluation does not show pathology that would require ongoing emergent intervention or inpatient treatment. I explained the diagnosis to the patient. Pain has been managed & has no complaints prior to dc. Pt is comfortable with above plan and is stable for discharge at this time. All questions were answered prior to disposition. Strict return precautions for f/u to the ED were discussed. Encouraged follow up with PCP.  She is seen and evaluated my attending who is agreed with the above plan.  Final Clinical Impressions(s) / ED Diagnoses   Final diagnoses:  Hyperglycemia    ED Discharge Orders    None       Wallace Keller 07/09/17 1109    Gerhard Munch, MD 07/10/17 [phone]

## 2017-07-09 NOTE — ED Notes (Signed)
Pt stable, ambulatory, and verbalizes understanding of d/c instructions.  

## 2017-07-09 NOTE — ED Notes (Signed)
IV team at bedside 

## 2017-07-09 NOTE — Progress Notes (Signed)
Consult for glucose meter[facility] patient will be provided with price list OR Medicaid covers 100% at any pharmacy: Accucheck Aviva Plus meter Aviva Plus Strips Soft Clix Accucheck Aviva Lancets 70/30 Insulin Novolog Insulin

## 2017-07-09 NOTE — Discharge Instructions (Signed)
Your workup has been reassuring in the ED.  Make sure that you continue take your insulin at home.  Follow-up with your primary care doctor.  Return the ED with any worsening symptoms.

## 2017-10-10 ENCOUNTER — Encounter (HOSPITAL_COMMUNITY): Payer: Self-pay

## 2017-10-10 ENCOUNTER — Emergency Department (HOSPITAL_COMMUNITY)
Admission: EM | Admit: 2017-10-10 | Discharge: 2017-10-11 | Disposition: A | Discharge status: Home / Self Care | Payer: Medicaid Other | Attending: Emergency Medicine | Admitting: Emergency Medicine

## 2017-10-10 DIAGNOSIS — R112 Nausea with vomiting, unspecified: Secondary | ICD-10-CM | POA: Diagnosis not present

## 2017-10-10 DIAGNOSIS — E1165 Type 2 diabetes mellitus with hyperglycemia: Secondary | ICD-10-CM | POA: Diagnosis not present

## 2017-10-10 DIAGNOSIS — Z87891 Personal history of nicotine dependence: Secondary | ICD-10-CM | POA: Diagnosis not present

## 2017-10-10 DIAGNOSIS — Z79899 Other long term (current) drug therapy: Secondary | ICD-10-CM | POA: Insufficient documentation

## 2017-10-10 DIAGNOSIS — R1084 Generalized abdominal pain: Secondary | ICD-10-CM | POA: Insufficient documentation

## 2017-10-10 DIAGNOSIS — R739 Hyperglycemia, unspecified: Secondary | ICD-10-CM

## 2017-10-10 LAB — COMPREHENSIVE METABOLIC PANEL
ALT: 22 U/L (ref 17–63)
AST: 31 U/L (ref 15–41)
Albumin: 4.1 g/dL (ref 3.5–5.0)
Alkaline Phosphatase: 62 U/L (ref 38–126)
Anion gap: 14 (ref 5–15)
BUN: 11 mg/dL (ref 6–20)
CO2: 20 mmol/L — ABNORMAL LOW (ref 22–32)
Calcium: 9.4 mg/dL (ref 8.9–10.3)
Chloride: 99 mmol/L — ABNORMAL LOW (ref 101–111)
Creatinine, Ser: 0.95 mg/dL (ref 0.61–1.24)
GFR calc Af Amer: >60 mL/min (ref >60)
GFR calc non Af Amer: >60 mL/min (ref >60)
Glucose, Bld: 253 mg/dL — ABNORMAL HIGH (ref 65–99)
Potassium: 5.7 mmol/L — ABNORMAL HIGH (ref 3.5–5.1)
Sodium: 133 mmol/L — ABNORMAL LOW (ref 135–145)
Total Bilirubin: 1.7 mg/dL — ABNORMAL HIGH (ref 0.3–1.2)
Total Protein: 7.7 g/dL (ref 6.5–8.1)

## 2017-10-10 LAB — CBC
HCT: 42.6 % (ref 39.0–52.0)
Hemoglobin: 15.4 g/dL (ref 13.0–17.0)
MCH: 33.8 pg (ref 26.0–34.0)
MCHC: 36.2 g/dL — ABNORMAL HIGH (ref 30.0–36.0)
MCV: 93.4 fL (ref 78.0–100.0)
PLATELETS: 286 10*3/uL (ref 150–400)
RBC: 4.56 MIL/uL (ref 4.22–5.81)
RDW: 12.3 % (ref 11.5–15.5)
WBC: 10.5 10*3/uL (ref 4.0–10.5)

## 2017-10-10 LAB — LIPASE, BLOOD: Lipase: 31 U/L (ref 11–51)

## 2017-10-10 LAB — POTASSIUM: Potassium: 3.8 mmol/L (ref 3.5–5.1)

## 2017-10-10 LAB — CBG MONITORING, ED: Glucose-Capillary: 281 mg/dL — ABNORMAL HIGH (ref 65–99)

## 2017-10-10 MED ORDER — KETOROLAC TROMETHAMINE 30 MG/ML IJ SOLN
30.0000 mg | Freq: Once | INTRAMUSCULAR | Status: AC
  Administered 2017-10-11: 30 mg via INTRAVENOUS
  Filled 2017-10-10: qty 1

## 2017-10-10 MED ORDER — SODIUM CHLORIDE 0.9 % IV BOLUS
1000.0000 mL | Freq: Once | INTRAVENOUS | Status: AC
  Administered 2017-10-10: 1000 mL via INTRAVENOUS

## 2017-10-10 MED ORDER — DICYCLOMINE HCL 10 MG/ML IM SOLN
20.0000 mg | Freq: Once | INTRAMUSCULAR | Status: AC
Start: 2017-10-10 — End: 2017-10-11
  Administered 2017-10-11: 20 mg via INTRAMUSCULAR
  Filled 2017-10-10: qty 2

## 2017-10-10 MED ORDER — ONDANSETRON 4 MG PO TBDP
4.0000 mg | ORAL_TABLET | Freq: Once | ORAL | Status: AC | PRN
  Administered 2017-10-10: 4 mg via ORAL
  Filled 2017-10-10: qty 1

## 2017-10-10 NOTE — ED Notes (Signed)
Pt awoke from sleep briefly to tell this RN he has had abd pain, N/V/D, subjective fever, and chills x 3 days. IV already in place in patient's hand upon assessment. Dressing changed, blood return noted. Pt reported mild discomfort when flushing IV however after initial flush pt denied pain. No redness, swelling, or warmth noted at site.

## 2017-10-10 NOTE — ED Provider Notes (Signed)
[facility] EMERGENCY DEPARTMENT Provider Note   CSN: [id number] Arrival date & time: 10/10/17  1526    History   Chief Complaint Chief Complaint  Patient presents with  . Abdominal Pain    HPI Mike Small is a 52 y.o. male.  52 year old male with a history of diabetes presents to the emergency department for evaluation of nausea and vomiting x3 days.  He reports 5-6 episodes of nonbloody, nonbilious emesis in the past 24 hours.  This has been associated with dry heaves.  His last bowel movement was earlier today and was more consistent with diarrhea.  This was also nonbloody.  He is complaining of generalized abdominal pain and cramping.  This has been constant since onset of nausea and emesis.  He reports worsening pain since onset.  No medications taken prior to arrival for symptoms.  No known fevers or sick contacts.  He states that he has dysuria on occasion, but has not noted any hematuria.  No history of abdominal surgeries.     Past Medical History:  Diagnosis Date  . Diabetes mellitus without complication (HCC)     There are no active problems to display for this patient.   Past Surgical History:  Procedure Laterality Date  . ANKLE SURGERY    . KNEE SURGERY    . SHOULDER SURGERY          Home Medications    Prior to Admission medications   Medication Sig Start Date End Date Taking? Authorizing Provider  acetaminophen (TYLENOL) 500 MG tablet Take 1,000 mg by mouth every 6 (six) hours as needed for mild pain.    [provider]  dicyclomine (BENTYL) 20 MG tablet Take 1 tablet (20 mg total) by mouth every 12 (twelve) hours as needed (abdominal pain/cramping). 10/11/17   Antony Madura, PA-C  gabapentin (NEURONTIN) 100 MG capsule Take 1 capsule (100 mg total) by mouth 2 (two) times daily. 11/16/13   Trixie Dredge, PA-C  HYDROcodone-acetaminophen (NORCO/VICODIN) 5-325 MG per tablet Take 1-2 tablets every 6 hours as needed for severe pain  01/04/15   Renne Crigler, PA-C  ibuprofen (ADVIL,MOTRIN) 600 MG tablet Take 1 tablet (600 mg total) by mouth every 6 (six) hours as needed. 10/25/16   Derwood Kaplan, MD  naproxen (NAPROSYN) 500 MG tablet Take 1 tablet (500 mg total) by mouth 2 (two) times daily. 01/04/15   Renne Crigler, PA-C  penicillin v potassium (VEETID) 500 MG tablet Take 1 tablet (500 mg total) by mouth 3 (three) times daily. 01/14/14   Renne Crigler, PA-C  promethazine (PHENERGAN) 25 MG tablet Take 1 tablet (25 mg total) by mouth every 6 (six) hours as needed for nausea or vomiting. 10/11/17   Antony Madura, PA-C    Family History History reviewed. No pertinent family history.  Social History Social History   Tobacco Use  . Smoking status: Former Games developer  . Smokeless tobacco: Never Used  Substance Use Topics  . Alcohol use: No  . Drug use: No     Allergies   Patient has no known allergies.   Review of Systems Review of Systems Ten systems reviewed and are negative for acute change, except as noted in the HPI.    Physical Exam Updated Vital Signs BP 131/68   Pulse (!) 52   Temp 99.8 F (37.7 C) (Oral)   Resp 18   Ht  (1.727 m)   Wt 99.8 kg (220 lb)   SpO2 97%   BMI 33.45  kg/m   Physical Exam  Constitutional: He is oriented to person, place, and time. He appears well-developed and well-nourished. No distress.  Nontoxic appearing and in NAD  HENT:  Head: Normocephalic and atraumatic.  Dry mm. Tolerating secretions.  Eyes: Conjunctivae and EOM are normal. No scleral icterus.  Neck: Normal range of motion.  Cardiovascular: Normal rate, regular rhythm and intact distal pulses.  Pulmonary/Chest: Effort normal. No stridor. No respiratory distress. He has no wheezes. He has no rales.  Lungs CTAB. Respirations even and unlabored  Abdominal: Soft. He exhibits no mass. There is tenderness. There is no guarding.  Soft abdomen with generalized TTP. Mild voluntary guarding. No palpable masses or  peritoneal signs.  Musculoskeletal: Normal range of motion.  Neurological: He is alert and oriented to person, place, and time. He exhibits normal muscle tone. Coordination normal.  Skin: Skin is warm and dry. No rash noted. He is not diaphoretic. No erythema. No pallor.  Psychiatric: He has a normal mood and affect. His behavior is normal.  Nursing note and vitals reviewed.    ED Treatments / Results  Labs (all labs ordered are listed, but only abnormal results are displayed) Labs Reviewed  COMPREHENSIVE METABOLIC PANEL - Abnormal; Notable for the following components:      Result Value   Sodium 133 (*)    Potassium 5.7 (*)    Chloride 99 (*)    CO2 20 (*)    Glucose, Bld 253 (*)    Total Bilirubin 1.7 (*)    All other components within normal limits  CBC - Abnormal; Notable for the following components:   MCHC 36.2 (*)    All other components within normal limits  CBG MONITORING, ED - Abnormal; Notable for the following components:   Glucose-Capillary 281 (*)    All other components within normal limits  LIPASE, BLOOD  POTASSIUM    EKG None  Radiology Ct Abdomen Pelvis W Contrast  Result Date: 10/11/2017 CLINICAL DATA:  Abdominal pain, nausea, and vomiting for 3 days. History of diabetes. EXAM: CT ABDOMEN AND PELVIS WITH CONTRAST TECHNIQUE: Multidetector CT imaging of the abdomen and pelvis was performed using the standard protocol following bolus administration of intravenous contrast. CONTRAST:  ISOVUE-370 IOPAMIDOL (ISOVUE-370) INJECTION 76% COMPARISON:  10/26/2005 FINDINGS: Lower chest: Lung bases are clear. Hepatobiliary: No focal liver abnormality is seen. No gallstones, gallbladder wall thickening, or biliary dilatation. Mild diffuse fatty infiltration of the liver. Pancreas: Unremarkable. No pancreatic ductal dilatation or surrounding inflammatory changes. Spleen: Normal in size without focal abnormality. Adrenals/Urinary Tract: Adrenal glands are unremarkable.  Kidneys are normal, without renal calculi, focal lesion, or hydronephrosis. Bladder is unremarkable. Stomach/Bowel: Stomach is within normal limits. Appendix appears normal. No evidence of bowel wall thickening, distention, or inflammatory changes. Vascular/Lymphatic: No significant vascular findings are present. No enlarged abdominal or pelvic lymph nodes. Reproductive: Prostate is unremarkable. Other: No abdominal wall hernia or abnormality. No abdominopelvic ascites. Musculoskeletal: Mild sclerosis demonstrated in the superior femoral heads bilaterally may indicate avascular necrosis. IMPRESSION: 1. No acute process demonstrated in the abdomen or pelvis. No evidence of bowel obstruction or inflammation. 2. Mild diffuse fatty infiltration of the liver. 3. Sclerosis in the superior femoral heads bilaterally may indicate avascular necrosis. Electronically Signed   By: Burman Nieves M.D.   On: 10/11/2017 01:45    Procedures Procedures (including critical care time)  Medications Ordered in ED Medications  ondansetron (ZOFRAN-ODT) disintegrating tablet 4 mg (4 mg Oral Given 10/10/17 1558)  sodium chloride  0.9 % bolus 1,000 mL (0 mLs Intravenous Stopped 10/11/17 0142)  dicyclomine (BENTYL) injection 20 mg (20 mg Intramuscular Given 10/11/17 0023)  ketorolac (TORADOL) 30 MG/ML injection 30 mg (30 mg Intravenous Given 10/11/17 0023)  morphine 4 MG/ML injection 4 mg (4 mg Intravenous Given 10/11/17 0035)  iopamidol (ISOVUE-370) 76 % injection 100 mL (100 mLs Intravenous Contrast Given 10/11/17 0114)    12:00 AM Repeat K+ WNL. Initial hyperK reading 2/2 hemolysis.  1:49 AM CT scan reviewed. Negative for acute or emergent cause of abdominal discomfort.  2:13 AM Patient reporting some improvement in his symptoms.  Blood pressure has normalized.  He is resting comfortably.  Will fluid challenge.  CT scan results conveyed to the patient and spouse at bedside.  Patient verbalizes understanding.  3:05  AM Tolerating oral fluids without vomiting.   Initial Impression / Assessment and Plan / ED Course  I have reviewed the triage vital signs and the nursing notes.  Pertinent labs & imaging results that were available during my care of the patient were reviewed by me and considered in my medical decision making (see chart for details).     Patient with symptoms consistent with likely viral gastroenteritis.  Vitals are stable, no fever.  Lungs are clear.  No focal abdominal pain.  No leukocytosis.  Liver and kidney function preserved.  Hyperglycemia noted without concern for DKA.  CT obtained which does not show any concerning or emergent etiology of symptoms today.  Patient has had clinical improvement with supportive care.  No signs of dehydration, tolerating PO fluids after antiemetics.  Supportive therapy indicated with return if symptoms worsen.  Patient given Rx for Phenergan and bentyl.  Return precautions discussed and provided. Patient discharged in stable condition with no unaddressed concerns.  Vitals:   10/11/17 0141 10/11/17 0200 10/11/17 0230 10/11/17 0300  BP:  128/64 124/69 131/68  Pulse: (!) 50 (!) 55 (!) 57 (!) 52  Resp:      Temp:      TempSrc:      SpO2: 97% 95% 95% 97%  Weight:      Height:        Final Clinical Impressions(s) / ED Diagnoses   Final diagnoses:  Generalized abdominal pain  Non-intractable vomiting with nausea, unspecified vomiting type  Hyperglycemia    ED Discharge Orders        Ordered    promethazine (PHENERGAN) 25 MG tablet  Every 6 hours PRN     10/11/17 0304    dicyclomine (BENTYL) 20 MG tablet  Every 12 hours PRN     10/11/17 0304       Antony Madura, PA-C 10/11/17 0981    Raeford Razor, MD 10/11/17 1538

## 2017-10-10 NOTE — ED Notes (Signed)
Caryn Bee RN unable to get enough blood for cbc, only could get cmp

## 2017-10-10 NOTE — ED Triage Notes (Signed)
Pt endorses abd with n/v x 3 days. Pt has DM and did not check cbg today. It was 265 yesterday. HR 48 in triage. All over VSS.

## 2017-10-10 NOTE — ED Notes (Signed)
Pt verbalized understanding regarding need for urine specimen. Pt does not need to void at this time.  

## 2017-10-11 ENCOUNTER — Emergency Department (HOSPITAL_COMMUNITY): Payer: Medicaid Other

## 2017-10-11 MED ORDER — DICYCLOMINE HCL 20 MG PO TABS: 20.0000 mg | ORAL_TABLET | Freq: Two times a day (BID) | ORAL | 0 refills | Status: DC | PRN

## 2017-10-11 MED ORDER — MORPHINE SULFATE (PF) 4 MG/ML IV SOLN
4.0000 mg | Freq: Once | INTRAVENOUS | Status: AC
  Administered 2017-10-11: 4 mg via INTRAVENOUS
  Filled 2017-10-11: qty 1

## 2017-10-11 MED ORDER — PROMETHAZINE HCL 25 MG PO TABS: 25.0000 mg | ORAL_TABLET | Freq: Four times a day (QID) | ORAL | 0 refills | Status: AC | PRN

## 2017-10-11 MED ORDER — IOPAMIDOL (ISOVUE-370) INJECTION 76%
100.0000 mL | Freq: Once | INTRAVENOUS | Status: AC | PRN
  Administered 2017-10-11: 100 mL via INTRAVENOUS

## 2017-10-11 NOTE — ED Notes (Signed)
Fluid challenge completed. No complaints of nausea, vomiting or increase in abdominal pain.

## 2017-10-11 NOTE — ED Notes (Signed)
Patient transported to CT 

## 2017-10-11 NOTE — Discharge Instructions (Signed)
Your blood tests and CT scan in the emergency department were reassuring and did not reveal a concerning cause of your abdominal pain.  This may be due to a viral illness.  We advise follow-up with your primary care doctor to ensure resolution of symptoms.  Avoid fried foods, fatty foods, greasy foods, and milk products until symptoms resolve. Be sure you drink plenty of clear liquids. We recommend the use of Phenergan as prescribed for nausea/vomiting.  You may return to the emergency department for new or concerning symptoms.

## 2017-10-13 ENCOUNTER — Encounter (HOSPITAL_COMMUNITY): Payer: Self-pay | Admitting: Emergency Medicine

## 2017-10-13 ENCOUNTER — Other Ambulatory Visit: Payer: Self-pay

## 2017-10-13 ENCOUNTER — Emergency Department (HOSPITAL_COMMUNITY)
Admission: EM | Admit: 2017-10-13 | Discharge: 2017-10-13 | Disposition: A | Discharge status: Home / Self Care | Payer: Medicaid Other | Attending: Emergency Medicine | Admitting: Emergency Medicine

## 2017-10-13 DIAGNOSIS — R197 Diarrhea, unspecified: Secondary | ICD-10-CM | POA: Insufficient documentation

## 2017-10-13 DIAGNOSIS — Z79899 Other long term (current) drug therapy: Secondary | ICD-10-CM | POA: Insufficient documentation

## 2017-10-13 DIAGNOSIS — R1011 Right upper quadrant pain: Secondary | ICD-10-CM | POA: Insufficient documentation

## 2017-10-13 DIAGNOSIS — Z794 Long term (current) use of insulin: Secondary | ICD-10-CM | POA: Insufficient documentation

## 2017-10-13 DIAGNOSIS — R1084 Generalized abdominal pain: Secondary | ICD-10-CM

## 2017-10-13 DIAGNOSIS — R0602 Shortness of breath: Secondary | ICD-10-CM | POA: Diagnosis not present

## 2017-10-13 DIAGNOSIS — Z87891 Personal history of nicotine dependence: Secondary | ICD-10-CM | POA: Diagnosis not present

## 2017-10-13 DIAGNOSIS — R112 Nausea with vomiting, unspecified: Secondary | ICD-10-CM | POA: Insufficient documentation

## 2017-10-13 DIAGNOSIS — E119 Type 2 diabetes mellitus without complications: Secondary | ICD-10-CM | POA: Diagnosis not present

## 2017-10-13 LAB — CBC WITH DIFFERENTIAL/PLATELET
BASOS PCT: 0 %
Basophils Absolute: 0 10*3/uL (ref 0.0–0.1)
Eosinophils Absolute: 0 10*3/uL (ref 0.0–0.7)
Eosinophils Relative: 0 %
HEMATOCRIT: 44.8 % (ref 39.0–52.0)
HEMOGLOBIN: 15.9 g/dL (ref 13.0–17.0)
LYMPHS ABS: 2.4 10*3/uL (ref 0.7–4.0)
LYMPHS PCT: 21 %
MCH: 33.1 pg (ref 26.0–34.0)
MCHC: 35.5 g/dL (ref 30.0–36.0)
MCV: 93.3 fL (ref 78.0–100.0)
MONO ABS: 0.4 10*3/uL (ref 0.1–1.0)
MONOS PCT: 4 %
NEUTROS ABS: 8.5 10*3/uL — AB (ref 1.7–7.7)
NEUTROS PCT: 75 %
Platelets: 298 10*3/uL (ref 150–400)
RBC: 4.8 MIL/uL (ref 4.22–5.81)
RDW: 12.3 % (ref 11.5–15.5)
WBC: 11.3 10*3/uL — ABNORMAL HIGH (ref 4.0–10.5)

## 2017-10-13 LAB — URINALYSIS, ROUTINE W REFLEX MICROSCOPIC
BILIRUBIN URINE: NEGATIVE
Glucose, UA: >=500 mg/dL — AB
HGB URINE DIPSTICK: NEGATIVE
Ketones, ur: 5 mg/dL — AB
Leukocytes, UA: NEGATIVE
NITRITE: NEGATIVE
Protein, ur: NEGATIVE mg/dL
Specific Gravity, Urine: 1.012 (ref 1.005–1.030)
pH: 8 (ref 5.0–8.0)

## 2017-10-13 LAB — COMPREHENSIVE METABOLIC PANEL
ALK PHOS: 61 U/L (ref 38–126)
ALT: 16 U/L — AB (ref 17–63)
AST: 17 U/L (ref 15–41)
Albumin: 4 g/dL (ref 3.5–5.0)
Anion gap: 11 (ref 5–15)
BUN: 6 mg/dL (ref 6–20)
CALCIUM: 9.5 mg/dL (ref 8.9–10.3)
CHLORIDE: 100 mmol/L — AB (ref 101–111)
CO2: 25 mmol/L (ref 22–32)
CREATININE: 1.04 mg/dL (ref 0.61–1.24)
GFR calc Af Amer: >60 mL/min (ref >60)
GFR calc non Af Amer: >60 mL/min (ref >60)
GLUCOSE: 171 mg/dL — AB (ref 65–99)
Potassium: 3.4 mmol/L — ABNORMAL LOW (ref 3.5–5.1)
SODIUM: 136 mmol/L (ref 135–145)
Total Bilirubin: 0.8 mg/dL (ref 0.3–1.2)
Total Protein: 7.5 g/dL (ref 6.5–8.1)

## 2017-10-13 LAB — I-STAT TROPONIN, ED: Troponin i, poc: 0.01 ng/mL (ref 0.00–0.08)

## 2017-10-13 LAB — LIPASE, BLOOD: Lipase: 30 U/L (ref 11–51)

## 2017-10-13 MED ORDER — GI COCKTAIL ~~LOC~~
30.0000 mL | Freq: Once | ORAL | Status: AC
  Administered 2017-10-13: 30 mL via ORAL
  Filled 2017-10-13: qty 30

## 2017-10-13 MED ORDER — MORPHINE SULFATE (PF) 4 MG/ML IV SOLN
4.0000 mg | Freq: Once | INTRAVENOUS | Status: AC
  Administered 2017-10-13: 4 mg via INTRAVENOUS
  Filled 2017-10-13: qty 1

## 2017-10-13 MED ORDER — ACETAMINOPHEN 500 MG PO TABS
1000.0000 mg | ORAL_TABLET | Freq: Once | ORAL | Status: AC
Start: 2017-10-13 — End: 2017-10-13
  Administered 2017-10-13: 1000 mg via ORAL
  Filled 2017-10-13: qty 2

## 2017-10-13 MED ORDER — ONDANSETRON HCL 4 MG/2ML IJ SOLN
4.0000 mg | Freq: Once | INTRAMUSCULAR | Status: AC
  Administered 2017-10-13: 4 mg via INTRAVENOUS
  Filled 2017-10-13: qty 2

## 2017-10-13 MED ORDER — SODIUM CHLORIDE 0.9 % IV BOLUS
1000.0000 mL | Freq: Once | INTRAVENOUS | Status: AC
Start: 2017-10-13 — End: 2017-10-13
  Administered 2017-10-13: 1000 mL via INTRAVENOUS

## 2017-10-13 MED ORDER — OMEPRAZOLE 20 MG PO CPDR: 20.0000 mg | DELAYED_RELEASE_CAPSULE | Freq: Every day | ORAL | 0 refills | Status: DC

## 2017-10-13 MED ORDER — CALCIUM CARBONATE ANTACID 500 MG PO CHEW: 2.0000 | CHEWABLE_TABLET | Freq: Two times a day (BID) | ORAL | 0 refills | Status: DC | PRN

## 2017-10-13 MED ORDER — HYDROCODONE-ACETAMINOPHEN 5-325 MG PO TABS: 1.0000 | ORAL_TABLET | ORAL | 0 refills | Status: DC | PRN

## 2017-10-13 NOTE — ED Notes (Signed)
Difficult IV start x 1. Another nurse to check.

## 2017-10-13 NOTE — ED Provider Notes (Signed)
[facility] EMERGENCY DEPARTMENT Provider Note   CSN: [id number] Arrival date & time: 10/13/17  1250     History   Chief Complaint Chief Complaint  Patient presents with  . Abdominal Pain    HPI Mike Small is a 52 y.o. male past medical history significant for diabetes presenting with generalized abdominal pain, nausea vomiting and nonbloody watery diarrhea and chills since this morning.  He was seen 3 days ago in the emergency department for same with negative CT and work-up. He was discharged home with a prescription for phenergan and bentyl but did not pick it up.  States that he improved for a day and all his symptoms started again this morning, no new symptoms.  He denies any cough, chest pain, does report periodic shortness of breath nonexertional and associated with retching and pain. He points to his bilateral upper quadrants as areas of discomfort. Denies any urinary symptoms or lower back pain.  Patient has not taken anything for his symptoms prior to arrival.  HPI  Past Medical History:  Diagnosis Date  . Diabetes mellitus without complication (HCC)     There are no active problems to display for this patient.   Past Surgical History:  Procedure Laterality Date  . ANKLE SURGERY    . KNEE SURGERY    . SHOULDER SURGERY          Home Medications    Prior to Admission medications   Medication Sig Start Date End Date Taking? Authorizing Provider  acetaminophen (TYLENOL) 500 MG tablet Take 1,000 mg by mouth every 6 (six) hours as needed for mild pain.   Yes [provider]  gabapentin (NEURONTIN) 100 MG capsule Take 1 capsule (100 mg total) by mouth 2 (two) times daily. 11/16/13  Yes West, Emily, PA-C  LANTUS SOLOSTAR 100 UNIT/ML Solostar Pen Inject 20 Units into the skin at bedtime. 08/15/17  Yes [provider]  NOVOLIN N 100 UNIT/ML injection Inject 10 Units into the skin 2 (two) times daily. 08/24/17  Yes [provider]  tetrahydrozoline-zinc (VISINE-AC) 0.05-0.25 % ophthalmic solution Place 1 drop into both eyes as needed (dry eyes).   Yes [provider]  calcium carbonate (TUMS) 500 MG chewable tablet Chew 2 tablets (400 mg of elemental calcium total) by mouth 2 (two) times daily as needed for indigestion or heartburn. 10/13/17   Georgiana Shore, PA-C  dicyclomine (BENTYL) 20 MG tablet Take 1 tablet (20 mg total) by mouth every 12 (twelve) hours as needed (abdominal pain/cramping). 10/11/17   Antony Madura, PA-C  HYDROcodone-acetaminophen (NORCO/VICODIN) 5-325 MG tablet Take 1 tablet by mouth every 4 (four) hours as needed for severe pain. 10/13/17   Georgiana Shore, PA-C  ibuprofen (ADVIL,MOTRIN) 600 MG tablet Take 1 tablet (600 mg total) by mouth every 6 (six) hours as needed. Patient not taking: Reported on 10/13/2017 10/25/16   Derwood Kaplan, MD  omeprazole (PRILOSEC) 20 MG capsule Take 1 capsule (20 mg total) by mouth daily. 10/13/17   Georgiana Shore, PA-C  promethazine (PHENERGAN) 25 MG tablet Take 1 tablet (25 mg total) by mouth every 6 (six) hours as needed for nausea or vomiting. 10/11/17   Antony Madura, PA-C    Family History No family history on file.  Social History Social History   Tobacco Use  . Smoking status: Former Games developer  . Smokeless tobacco: Never Used  Substance Use Topics  . Alcohol use: No  . Drug use: No  Allergies   Patient has no known allergies.   Review of Systems Review of Systems  Constitutional: Positive for chills. Negative for fatigue and fever.  HENT: Negative for congestion.   Respiratory: Positive for shortness of breath. Negative for cough, choking, chest tightness, wheezing and stridor.   Cardiovascular: Negative for chest pain and palpitations.  Gastrointestinal: Positive for abdominal pain, diarrhea, nausea and vomiting. Negative for abdominal distention, anal bleeding and blood in stool.  Genitourinary: Negative for decreased urine  volume, difficulty urinating, dysuria, flank pain, frequency, hematuria, scrotal swelling and testicular pain.  Musculoskeletal: Negative for back pain and joint swelling.  Skin: Negative for color change, pallor and rash.  Neurological: Negative for dizziness, syncope, weakness, light-headedness, numbness and headaches.     Physical Exam Updated Vital Signs BP 109/65 (BP Location: Left Arm)   Pulse (!) 54   Temp 99 F (37.2 C)   Resp 14   Wt 99.8 kg (220 lb)   SpO2 99%   BMI 33.45 kg/m   Physical Exam  Constitutional: He appears well-developed and well-nourished.  Non-toxic appearance. He does not appear ill. No distress.  HENT:  Head: Normocephalic and atraumatic.  Eyes: Conjunctivae are normal.  Neck: Neck supple.  Cardiovascular: Normal rate, regular rhythm and normal heart sounds.  No murmur heard. Pulmonary/Chest: Effort normal and breath sounds normal. No stridor. No respiratory distress. He has no wheezes. He has no rhonchi. He has no rales. He exhibits no tenderness.  Abdominal: Soft. Normal appearance and bowel sounds are normal. He exhibits no distension, no pulsatile midline mass and no mass. There is generalized tenderness and tenderness in the right upper quadrant and left upper quadrant. There is guarding. There is no rigidity, no rebound, no CVA tenderness and no tenderness at McBurney's point.  Musculoskeletal: He exhibits no edema.  Neurological: He is alert.  Skin: Skin is warm and dry. No rash noted. He is not diaphoretic. No cyanosis or erythema. No pallor.  Psychiatric: He has a normal mood and affect.  Nursing note and vitals reviewed.    ED Treatments / Results  Labs (all labs ordered are listed, but only abnormal results are displayed) Labs Reviewed  COMPREHENSIVE METABOLIC PANEL - Abnormal; Notable for the following components:      Result Value   Potassium 3.4 (*)    Chloride 100 (*)    Glucose, Bld 171 (*)    ALT 16 (*)    All other  components within normal limits  CBC WITH DIFFERENTIAL/PLATELET - Abnormal; Notable for the following components:   WBC 11.3 (*)    Neutro Abs 8.5 (*)    All other components within normal limits  URINALYSIS, ROUTINE W REFLEX MICROSCOPIC - Abnormal; Notable for the following components:   APPearance HAZY (*)    Glucose, UA >=500 (*)    Ketones, ur 5 (*)    Bacteria, UA RARE (*)    All other components within normal limits  LIPASE, BLOOD  I-STAT TROPONIN, ED    EKG EKG Interpretation  Date/Time:  Thursday Oct 13 2017 13:05:43 EDT Ventricular Rate:  48 PR Interval:    QRS Duration: 130 QT Interval:  484 QTC Calculation: 433 R Axis:   54 Text Interpretation:  Sinus bradycardia Nonspecific intraventricular conduction delay Anteroseptal infarct, old Confirmed by Vanetta Mulders [phone]) on 10/13/2017 1:23:11 PM   Radiology No results found.  Procedures Procedures (including critical care time)  Medications Ordered in ED Medications  ondansetron (ZOFRAN) injection 4 mg (4 mg  Intravenous Given 10/13/17 1546)  sodium chloride 0.9 % bolus 1,000 mL (0 mLs Intravenous Stopped 10/13/17 1752)  morphine 4 MG/ML injection 4 mg (4 mg Intravenous Given 10/13/17 1547)  acetaminophen (TYLENOL) tablet 1,000 mg (1,000 mg Oral Given 10/13/17 1752)  morphine 4 MG/ML injection 4 mg (4 mg Intravenous Given 10/13/17 1753)  gi cocktail (Maalox,Lidocaine,Donnatal) (30 mLs Oral Given 10/13/17 1913)     Initial Impression / Assessment and Plan / ED Course  I have reviewed the triage vital signs and the nursing notes.  Pertinent labs & imaging results that were available during my care of the patient were reviewed by me and considered in my medical decision making (see chart for details).    Patient returning with generalized upper quadrant abdominal discomfort with nausea vomiting diarrhea since this morning.  He was seen in the emergency department on 10/10/2017 with a CT showing some fatty infiltrate of the  liver but no acute findings to explain patient's symptoms.  He was sent home with symptomatic relief but did not pick up his prescriptions.  He reports that he felt better for a day but symptoms returned this morning. IV team had to be called due to difficulty obtaining IV for this patient.  Patient bradycardic which appears to be his baseline from chart review of previous visits. EKG sinus brady. Negative troponin.  Labs unremarkable today and unchanged from 3 days ago. Patient's temp trended up initially to high of 100.9. Mild leukocytosis at 11.3  Patient was provided with analgesia and on reassessment was sleeping comfortably, but reported no improvement in his symptoms.   Patient was given more antiemetic and analgesia and will reassess. Temperature trending down after tylenol On reassessment, patient continues to reports 10/10 pain.   Patient was given GI cocktail On further reassessment, patient reported feeling "a whole lot better". Repeat abdominal exam without ttp and abdomen is soft.  Denies any vomiting in the ER and successful PO challenge.  He also explained that after being discharged last time, he thought he was feeling well and went to a seafood restaurant and had a large bowl of mussels and shrimp which he reports had a questionable taste and got sick again.  Patient is well-appearing, nontoxic and improving. Will dc home with symptomatic relief and close GI and PCP follow up.  Discussed strict return precautions and advised to return to the emergency department if experiencing any new or worsening symptoms. Instructions were understood and patient agreed with discharge plan.  Final Clinical Impressions(s) / ED Diagnoses   Final diagnoses:  Nausea vomiting and diarrhea  Generalized abdominal pain    ED Discharge Orders        Ordered    HYDROcodone-acetaminophen (NORCO/VICODIN) 5-325 MG tablet  Every 4 hours PRN     10/13/17 2127    omeprazole (PRILOSEC) 20 MG  capsule  Daily     10/13/17 2127    calcium carbonate (TUMS) 500 MG chewable tablet  2 times daily PRN     10/13/17 2127       Georgiana Shore, PA-C 10/13/17 2134    Vanetta Mulders, MD 10/13/17 2328

## 2017-10-13 NOTE — Discharge Instructions (Addendum)
As discussed, you blood work did not show any acute abnormalities or changes from 3 days ago. Make sure to stay well-hydrated drinking enough fluids to keep your urine clear.  Take your nausea medication as needed and Bentyl as needed for diarrhea.  Start taking omeprazole 30 minutes before breakfast every day and follow-up with gastroenterology. Take 2 tablets of calcium carbonate as needed for heartburn and abdominal pain as needed.  Start reintroducing food slowly beginning with broth and bland foods.  Avoid fatty and spicy foods. Follow up with your primary care provider. Return if symptoms worsen or new concerning symptoms in the meantime.

## 2017-10-13 NOTE — ED Triage Notes (Signed)
Pt here after having NVD x 1 week. Endorses 10/10 epigastric pain. HR 43 for EMS at lowest rate. States he vomited x 1 today. 195/98, 243 CBG (did not take his Novolin today) 18 RR. Pt AO x4

## 2017-10-13 NOTE — ED Notes (Signed)
IV team states they were unsuccessful and sending another IV nurse to start IV

## 2020-03-27 ENCOUNTER — Emergency Department (HOSPITAL_COMMUNITY)
Admission: EM | Admit: 2020-03-27 | Discharge: 2020-03-27 | Disposition: A | Discharge status: Home / Self Care | Payer: Medicaid Other | Attending: Emergency Medicine | Admitting: Emergency Medicine

## 2020-03-27 ENCOUNTER — Encounter (HOSPITAL_COMMUNITY): Payer: Self-pay | Admitting: Emergency Medicine

## 2020-03-27 ENCOUNTER — Emergency Department (HOSPITAL_COMMUNITY): Payer: Medicaid Other

## 2020-03-27 DIAGNOSIS — Z794 Long term (current) use of insulin: Secondary | ICD-10-CM | POA: Insufficient documentation

## 2020-03-27 DIAGNOSIS — S61226A Laceration with foreign body of right little finger without damage to nail, initial encounter: Secondary | ICD-10-CM

## 2020-03-27 DIAGNOSIS — S61214A Laceration without foreign body of right ring finger without damage to nail, initial encounter: Secondary | ICD-10-CM

## 2020-03-27 DIAGNOSIS — S6991XA Unspecified injury of right wrist, hand and finger(s), initial encounter: Secondary | ICD-10-CM | POA: Diagnosis present

## 2020-03-27 DIAGNOSIS — Y92000 Kitchen of unspecified non-institutional (private) residence as  the place of occurrence of the external cause: Secondary | ICD-10-CM | POA: Insufficient documentation

## 2020-03-27 DIAGNOSIS — Z23 Encounter for immunization: Secondary | ICD-10-CM | POA: Insufficient documentation

## 2020-03-27 DIAGNOSIS — Z87891 Personal history of nicotine dependence: Secondary | ICD-10-CM | POA: Insufficient documentation

## 2020-03-27 DIAGNOSIS — W19XXXA Unspecified fall, initial encounter: Secondary | ICD-10-CM

## 2020-03-27 DIAGNOSIS — E119 Type 2 diabetes mellitus without complications: Secondary | ICD-10-CM | POA: Diagnosis not present

## 2020-03-27 DIAGNOSIS — S61224A Laceration with foreign body of right ring finger without damage to nail, initial encounter: Secondary | ICD-10-CM | POA: Diagnosis not present

## 2020-03-27 DIAGNOSIS — W1789XA Other fall from one level to another, initial encounter: Secondary | ICD-10-CM | POA: Diagnosis not present

## 2020-03-27 MED ORDER — TETANUS-DIPHTH-ACELL PERTUSSIS 5-2.5-18.5 LF-MCG/0.5 IM SUSP
0.5000 mL | Freq: Once | INTRAMUSCULAR | Status: AC
  Administered 2020-03-27: 0.5 mL via INTRAMUSCULAR
  Filled 2020-03-27: qty 0.5

## 2020-03-27 MED ORDER — HYDROCODONE-ACETAMINOPHEN 5-325 MG PO TABS: 1.0000 | ORAL_TABLET | ORAL | 0 refills | Status: DC | PRN

## 2020-03-27 MED ORDER — OXYCODONE-ACETAMINOPHEN 5-325 MG PO TABS
1.0000 | ORAL_TABLET | ORAL | Status: DC | PRN
  Administered 2020-03-27: 1 via ORAL
  Filled 2020-03-27: qty 1

## 2020-03-27 MED ORDER — CEPHALEXIN 500 MG PO CAPS: 500.0000 mg | ORAL_CAPSULE | Freq: Two times a day (BID) | ORAL | 0 refills | Status: AC

## 2020-03-27 MED ORDER — LIDOCAINE HCL (PF) 1 % IJ SOLN
10.0000 mL | Freq: Once | INTRAMUSCULAR | Status: AC
  Administered 2020-03-27: 10 mL
  Filled 2020-03-27: qty 10

## 2020-03-27 MED ORDER — MORPHINE SULFATE (PF) 4 MG/ML IV SOLN
4.0000 mg | Freq: Once | INTRAVENOUS | Status: AC
  Administered 2020-03-27: 4 mg via INTRAVENOUS
  Filled 2020-03-27: qty 1

## 2020-03-27 NOTE — ED Notes (Signed)
Pt now complains about numbness in ring finger

## 2020-03-27 NOTE — ED Triage Notes (Signed)
Pt arrives to ED with chief complaint of finger injury.  Pt was standing on top of his kitchen counter around 1am this morning when he fell backwards and tired to catch himself with his right hand and has open wounds to 3rd and 4th digit on right hand. Bleeding controlled at this time with non adherent dressing.

## 2020-03-27 NOTE — Consult Note (Signed)
Reason for Consult:Right ring finger lac Referring Physician: Zohan Shiflet is an 54 y.o. male.  HPI: Mike Small was cleaning some walls and went to step from a ladder to a table and fell about 4 feet. He put his right hand down to break his fall and the impact tore his ring finger and, to a lesser extent, his little finger. He came to the ED later for evaluation c/o pain and numbness in the finger. He is RHD and works as a Engineer, water.  Past Medical History:  Diagnosis Date  . Diabetes mellitus without complication [facility])     Past Surgical History:  Procedure Laterality Date  . ANKLE SURGERY    . KNEE SURGERY    . SHOULDER SURGERY      No family history on file.  Social History:  reports that he has quit smoking. He has never used smokeless tobacco. He reports that he does not drink alcohol and does not use drugs.  Allergies: No Known Allergies  Medications: I have reviewed the patient's current medications.  No results found for this or any previous visit (from the past 48 hour(s)).  DG Hand Complete Right  Result Date: 03/27/2020 CLINICAL DATA:  Fall. EXAM: RIGHT HAND - COMPLETE 3+ VIEW COMPARISON:  None. FINDINGS: There is no evidence of fracture or dislocation. There is no evidence of arthropathy or other focal bone abnormality. Soft tissues are unremarkable. IMPRESSION: Negative. Electronically Signed   By: Kennith Center M.D.   On: 03/27/2020 08:28    Review of Systems  HENT: Negative for ear discharge, ear pain, hearing loss and tinnitus.   Eyes: Negative for photophobia and pain.  Respiratory: Negative for cough and shortness of breath.   Cardiovascular: Negative for chest pain.  Gastrointestinal: Negative for abdominal pain, nausea and vomiting.  Genitourinary: Negative for dysuria, flank pain, frequency and urgency.  Musculoskeletal: Positive for arthralgias (Right ring finger). Negative for back pain, myalgias and neck pain.  Neurological: Negative for  dizziness and headaches.  Hematological: Does not bruise/bleed easily.  Psychiatric/Behavioral: The patient is not nervous/anxious.    Blood pressure (!) 164/92, pulse (!) 59, temperature 98.2 F (36.8 C), temperature source Oral, resp. rate 18, height 5\' 8"  (1.727 m), weight 102.1 kg, SpO2 98 %. Physical Exam Constitutional:      General: He is not in acute distress.    Appearance: He is well-developed. He is not diaphoretic.  HENT:     Head: Normocephalic and atraumatic.  Eyes:     General: No scleral icterus.       Right eye: No discharge.        Left eye: No discharge.     Conjunctiva/sclera: Conjunctivae normal.  Cardiovascular:     Rate and Rhythm: Normal rate and regular rhythm.  Pulmonary:     Effort: Pulmonary effort is normal. No respiratory distress.  Musculoskeletal:     Cervical back: Normal range of motion.     Comments: Right shoulder, elbow, wrist, digits- Complex laceration volar P2 ring finger, severe TTP, weak DIP flexion but pain makes assessment difficult, anesthetic radial/ulna, no instability, no blocks to motion  Sens  Ax/R/M/U intact  Mot   Ax/ R/ PIN/ M/ AIN/ U intact  Rad 2+  Skin:    General: Skin is warm and dry.  Neurological:     Mental Status: He is alert.  Psychiatric:        Behavior: Behavior normal.     Assessment/Plan: Right ring finger  lac -- Difficult to assess in current state. Recommended EDPA numb finger so better exam can be documented. If flexor tendon seems injured will likely need repair but otherwise can close and see Dr. Arita Miss in office.    Freeman Caldron, PA-C Orthopedic Surgery [phone] 03/27/2020, 12:57 PM

## 2020-03-27 NOTE — Discharge Instructions (Signed)
Sutures need to be removed in 7- 10 days  Return for new or worsening symptoms

## 2020-03-27 NOTE — ED Provider Notes (Signed)
[facility] EMERGENCY DEPARTMENT Provider Note   CSN: [id number] Arrival date & time: 03/27/20  2505     History Chief Complaint  Patient presents with  . Finger Injury    Mike Small is a 54 y.o. male with past medical history significant for diabetes who resents for evaluation of hand laceration.  Patient states that approximately 1 AM, 8 hours PTA he fell off of a countertop.  He subsequently hit his hand on a marble countertop.  Suffered lacerations to his 4th and 5th digit on his right upper extremity.  Unknown last tetanus.  Bleeding is controlled.  Rates his pain a 10/10.  Intermittent paresthesias to radial aspect fourth digit right upper extremity.  No active bleeding or drainage.  No swelling, redness, warmth.  No decreased range of motion.  No fevers or chills.  Denies additional aggravating or alleviating factors.  History obtained from patient and past medical records.  No interpreter used.    HPI     Past Medical History:  Diagnosis Date  . Diabetes mellitus without complication (HCC)     There are no problems to display for this patient.   Past Surgical History:  Procedure Laterality Date  . ANKLE SURGERY    . KNEE SURGERY    . SHOULDER SURGERY         No family history on file.  Social History   Tobacco Use  . Smoking status: Former Games developer  . Smokeless tobacco: Never Used  Substance Use Topics  . Alcohol use: No  . Drug use: No    Home Medications Prior to Admission medications   Medication Sig Start Date End Date Taking? Authorizing Provider  acetaminophen (TYLENOL) 500 MG tablet Take 1,000 mg by mouth every 6 (six) hours as needed for mild pain.    [provider]  calcium carbonate (TUMS) 500 MG chewable tablet Chew 2 tablets (400 mg of elemental calcium total) by mouth 2 (two) times daily as needed for indigestion or heartburn. 10/13/17   Georgiana Shore, PA-C  cephALEXin (KEFLEX) 500 MG capsule Take 1  capsule (500 mg total) by mouth 2 (two) times daily for 7 days. 03/27/20 04/03/20  Lashawndra Lampkins A, PA-C  dicyclomine (BENTYL) 20 MG tablet Take 1 tablet (20 mg total) by mouth every 12 (twelve) hours as needed (abdominal pain/cramping). 10/11/17   Antony Madura, PA-C  gabapentin (NEURONTIN) 100 MG capsule Take 1 capsule (100 mg total) by mouth 2 (two) times daily. 11/16/13   Trixie Dredge, PA-C  HYDROcodone-acetaminophen (NORCO/VICODIN) 5-325 MG tablet Take 1 tablet by mouth every 4 (four) hours as needed. 03/27/20   Ger Ringenberg A, PA-C  ibuprofen (ADVIL,MOTRIN) 600 MG tablet Take 1 tablet (600 mg total) by mouth every 6 (six) hours as needed. Patient not taking: Reported on 10/13/2017 10/25/16   Derwood Kaplan, MD  LANTUS SOLOSTAR 100 UNIT/ML Solostar Pen Inject 20 Units into the skin at bedtime. 08/15/17   [provider]  NOVOLIN N 100 UNIT/ML injection Inject 10 Units into the skin 2 (two) times daily. 08/24/17   [provider]  omeprazole (PRILOSEC) 20 MG capsule Take 1 capsule (20 mg total) by mouth daily. 10/13/17   Georgiana Shore, PA-C  promethazine (PHENERGAN) 25 MG tablet Take 1 tablet (25 mg total) by mouth every 6 (six) hours as needed for nausea or vomiting. 10/11/17   Antony Madura, PA-C  tetrahydrozoline-zinc (VISINE-AC) 0.05-0.25 % ophthalmic solution Place 1 drop into both eyes as needed (  dry eyes).    [provider]    Allergies    Patient has no known allergies.  Review of Systems   Review of Systems  Constitutional: Negative.   HENT: Negative.   Eyes: Negative.   Respiratory: Negative.   Cardiovascular: Negative.   Gastrointestinal: Negative.   Genitourinary: Negative.   Musculoskeletal: Negative.   Skin: Positive for wound.  All other systems reviewed and are negative.   Physical Exam Updated Vital Signs BP (!) 164/92   Pulse (!) 59   Temp 98.2 F (36.8 C) (Oral)   Resp 18   Ht 5\' 8"  (1.727 m)   Wt 102.1 kg   SpO2 98%   BMI  34.21 kg/m   Physical Exam Vitals and nursing note reviewed.  Constitutional:      General: He is not in acute distress.    Appearance: He is well-developed. He is not ill-appearing, toxic-appearing or diaphoretic.  HENT:     Head: Normocephalic and atraumatic.     Nose: Nose normal.     Mouth/Throat:     Mouth: Mucous membranes are moist.  Eyes:     Pupils: Pupils are equal, round, and reactive to light.  Cardiovascular:     Rate and Rhythm: Normal rate and regular rhythm.     Pulses: Normal pulses.     Heart sounds: Normal heart sounds.  Pulmonary:     Effort: Pulmonary effort is normal. No respiratory distress.     Breath sounds: Normal breath sounds.  Abdominal:     General: Bowel sounds are normal. There is no distension.     Palpations: Abdomen is soft.  Musculoskeletal:        General: Normal range of motion.     Cervical back: Normal range of motion and neck supple.     Comments: Able to flex at DIP, PIP to digits on right upper extremity.  Tenderness to palpation.  No obvious deformity.  No bony tenderness to metacarpals, wrist.  Skin:    General: Skin is warm and dry.     Comments: Jagged laceration totaling 5 cm to 4th digit, volar aspect right upper extremity.  Does not extend into nailbed.  There is 1 cm, rounded, skin abrasion to volar aspect at DIP to fifth digit.  Slight ooze of blood  Neurological:     General: No focal deficit present.     Mental Status: He is alert and oriented to person, place, and time.     Comments: Intact sensation to radial ulnar aspect of all digits Equal grip strength bilaterally         ED Results / Procedures / Treatments   Labs (all labs ordered are listed, but only abnormal results are displayed) Labs Reviewed - No data to display  EKG None  Radiology DG Hand Complete Right  Result Date: 03/27/2020 CLINICAL DATA:  Fall. EXAM: RIGHT HAND - COMPLETE 3+ VIEW COMPARISON:  None. FINDINGS: There is no evidence of  fracture or dislocation. There is no evidence of arthropathy or other focal bone abnormality. Soft tissues are unremarkable. IMPRESSION: Negative. Electronically Signed   By: 07-10-1971 M.D.   On: 03/27/2020 08:28    Procedures .10/16/2021Laceration Repair  Date/Time: 03/27/2020 12:53 PM Performed by: 03/29/2020, PA-C Authorized by: Linwood Dibbles, PA-C   Consent:    Consent obtained:  Verbal   Consent given by:  Patient   Risks discussed:  Infection, need for additional repair, pain, poor cosmetic result and  poor wound healing   Alternatives discussed:  No treatment and delayed treatment Universal protocol:    Procedure explained and questions answered to patient or proxy's satisfaction: yes     Relevant documents present and verified: yes     Test results available and properly labeled: yes     Imaging studies available: yes     Required blood products, implants, devices, and special equipment available: yes     Site/side marked: yes     Immediately prior to procedure, a time out was called: yes     Patient identity confirmed:  Verbally with patient Anesthesia (see MAR for exact dosages):    Anesthesia method:  Local infiltration   Local anesthetic:  Lidocaine 1% w/o epi Laceration details:    Location:  Finger   Finger location:  R ring finger   Length (cm):  5   Depth (mm):  3 Repair type:    Repair type:  Complex Pre-procedure details:    Preparation:  Patient was prepped and draped in usual sterile fashion and imaging obtained to evaluate for foreign bodies Exploration:    Hemostasis achieved with:  Direct pressure   Wound exploration: wound explored through full range of motion and entire depth of wound probed and visualized     Contaminated: yes   Treatment:    Area cleansed with:  Betadine   Amount of cleaning:  Extensive   Irrigation solution:  Sterile saline   Irrigation volume:  1 L   Irrigation method:  Pressure wash   Visualized foreign  bodies/material removed: yes     Debridement:  None   Undermining:  None Skin repair:    Repair method:  Sutures   Suture size:  5-0   Suture material:  Prolene   Suture technique:  Simple interrupted   Number of sutures:  17 Approximation:    Approximation:  Close Post-procedure details:    Dressing:  Non-adherent dressing and splint for protection   Patient tolerance of procedure:  Tolerated well, no immediate complications .Marland KitchenLaceration Repair  Date/Time: 03/27/2020 12:54 PM Performed by: Linwood Dibbles, PA-C Authorized by: Linwood Dibbles, PA-C   Consent:    Consent obtained:  Verbal   Consent given by:  Patient   Risks discussed:  Infection, need for additional repair, pain, poor cosmetic result and poor wound healing   Alternatives discussed:  No treatment and delayed treatment Universal protocol:    Procedure explained and questions answered to patient or proxy's satisfaction: yes     Relevant documents present and verified: yes     Test results available and properly labeled: yes     Imaging studies available: yes     Required blood products, implants, devices, and special equipment available: yes     Site/side marked: yes     Immediately prior to procedure, a time out was called: yes     Patient identity confirmed:  Verbally with patient Anesthesia (see MAR for exact dosages):    Anesthesia method:  None Laceration details:    Location:  Finger   Finger location:  R small finger   Length (cm):  1   Depth (mm):  2 Repair type:    Repair type:  Simple Pre-procedure details:    Preparation:  Patient was prepped and draped in usual sterile fashion and imaging obtained to evaluate for foreign bodies Exploration:    Wound exploration: wound explored through full range of motion and entire depth of wound probed and visualized  Treatment:    Area cleansed with:  Betadine   Amount of cleaning:  Extensive   Irrigation solution:  Sterile saline   Irrigation  method:  Pressure wash Skin repair:    Repair method:  Tissue adhesive Approximation:    Approximation:  Close Post-procedure details:    Dressing:  Open (no dressing)   Patient tolerance of procedure:  Tolerated well, no immediate complications .Nerve Block  Date/Time: 03/27/2020 12:54 PM Performed by: Linwood Dibbles, PA-C Authorized by: Linwood Dibbles, PA-C   Consent:    Consent obtained:  Verbal   Consent given by:  Patient   Risks discussed:  Allergic reaction, infection, nerve damage, swelling, unsuccessful block, pain, intravenous injection and bleeding   Alternatives discussed:  No treatment, delayed treatment, alternative treatment and referral Indications:    Indications:  Pain relief and procedural anesthesia Location:    Body area:  Upper extremity   Upper extremity nerve blocked: digit.   Laterality:  Right Pre-procedure details:    Skin preparation:  Povidone-iodine   Preparation: Patient was prepped and draped in usual sterile fashion   Skin anesthesia (see MAR for exact dosages):    Skin anesthesia method:  None Procedure details (see MAR for exact dosages):    Anesthetic injected:  Lidocaine 1% w/o epi   Steroid injected:  None   Additive injected:  None   Injection procedure:  Anatomic landmarks identified, anatomic landmarks palpated, incremental injection, negative aspiration for blood and introduced needle Post-procedure details:    Dressing:  None   Outcome:  Anesthesia achieved   Patient tolerance of procedure:  Tolerated well, no immediate complications   (including critical care time)  Medications Ordered in ED Medications  oxyCODONE-acetaminophen (PERCOCET/ROXICET) 5-325 MG per tablet 1 tablet (1 tablet Oral Given 03/27/20 0756)  morphine 4 MG/ML injection 4 mg (4 mg Intravenous Given 03/27/20 0933)  lidocaine (PF) (XYLOCAINE) 1 % injection 10 mL (10 mLs Infiltration Given 03/27/20 1100)  Tdap (BOOSTRIX) injection 0.5 mL (0.5 mLs  Intramuscular Given 03/27/20 1239)  morphine 4 MG/ML injection 4 mg (4 mg Intravenous Given 03/27/20 1239)   ED Course  I have reviewed the triage vital signs and the nursing notes.  Pertinent labs & imaging results that were available during my care of the patient were reviewed by me and considered in my medical decision making (see chart for details).   Patient presents for laceration sustained to volar aspect digits 4 and 5 on right upper extremity.  He is neurovascularly intact initial evaluation with full range of motion.  Tetanus not up-to-date, will update.  Will pain control, x-ray.  Xray negative for acute fracture, dislocation. Lacerations to ventral aspect to 4th and 5th on right hand, no sensation to radial aspect to ring finger per patient now.  Given reassessment patient states he now has some numbness of finger I consulted with Earney Hamburg, PA with orthopedics.  Recommends ring block and reassess.  Patient reassessed. Intact movement after block. Patient has placed #17 sutures to laceration. 5th digit laceration does not need sutures however cleaned thoroughly and debris removed.  Splint placed to finger for limited movement to keep sutures intact. States on Abx given extensive wound and hx of DM. Encouraged follow up with hand surgery. Discussed strict return precautions. Voiced understanding and is agreeable for follow up.  The patient has been appropriately medically screened and/or stabilized in the ED. I have low suspicion for any other emergent medical condition which would require further screening,  evaluation or treatment in the ED or require inpatient management.  Patient is hemodynamically stable and in no acute distress.  Patient able to ambulate in department prior to ED.  Evaluation does not show acute pathology that would require ongoing or additional emergent interventions while in the emergency department or further inpatient treatment.  I have discussed the  diagnosis with the patient and answered all questions.  Pain is been managed while in the emergency department and patient has no further complaints prior to discharge.  Patient is comfortable with plan discussed in room and is stable for discharge at this time.  I have discussed strict return precautions for returning to the emergency department.  Patient was encouraged to follow-up with PCP/specialist refer to at discharge.    MDM Rules/Calculators/A&P                           Final Clinical Impression(s) / ED Diagnoses Final diagnoses:  Fall, initial encounter  Laceration of right ring finger without foreign body without damage to nail, initial encounter  Laceration of right little finger with foreign body without damage to nail, initial encounter    Rx / DC Orders ED Discharge Orders         Ordered    HYDROcodone-acetaminophen (NORCO/VICODIN) 5-325 MG tablet  Every 4 hours PRN        03/27/20 1249    cephALEXin (KEFLEX) 500 MG capsule  2 times daily        03/27/20 1249           Freddy Spadafora A, PA-C 03/27/20 1255    Vanetta MuldersZackowski, Scott, MD 03/28/20 [phone]

## 2020-12-06 IMAGING — DX DG HAND COMPLETE 3+V*R*
4 series · 4 of 4 positions shown · non-contrast
Comparison: None.

CLINICAL DATA: Fall.

EXAM:
RIGHT HAND - COMPLETE 3+ VIEW

[hand pa (1 of 2)]
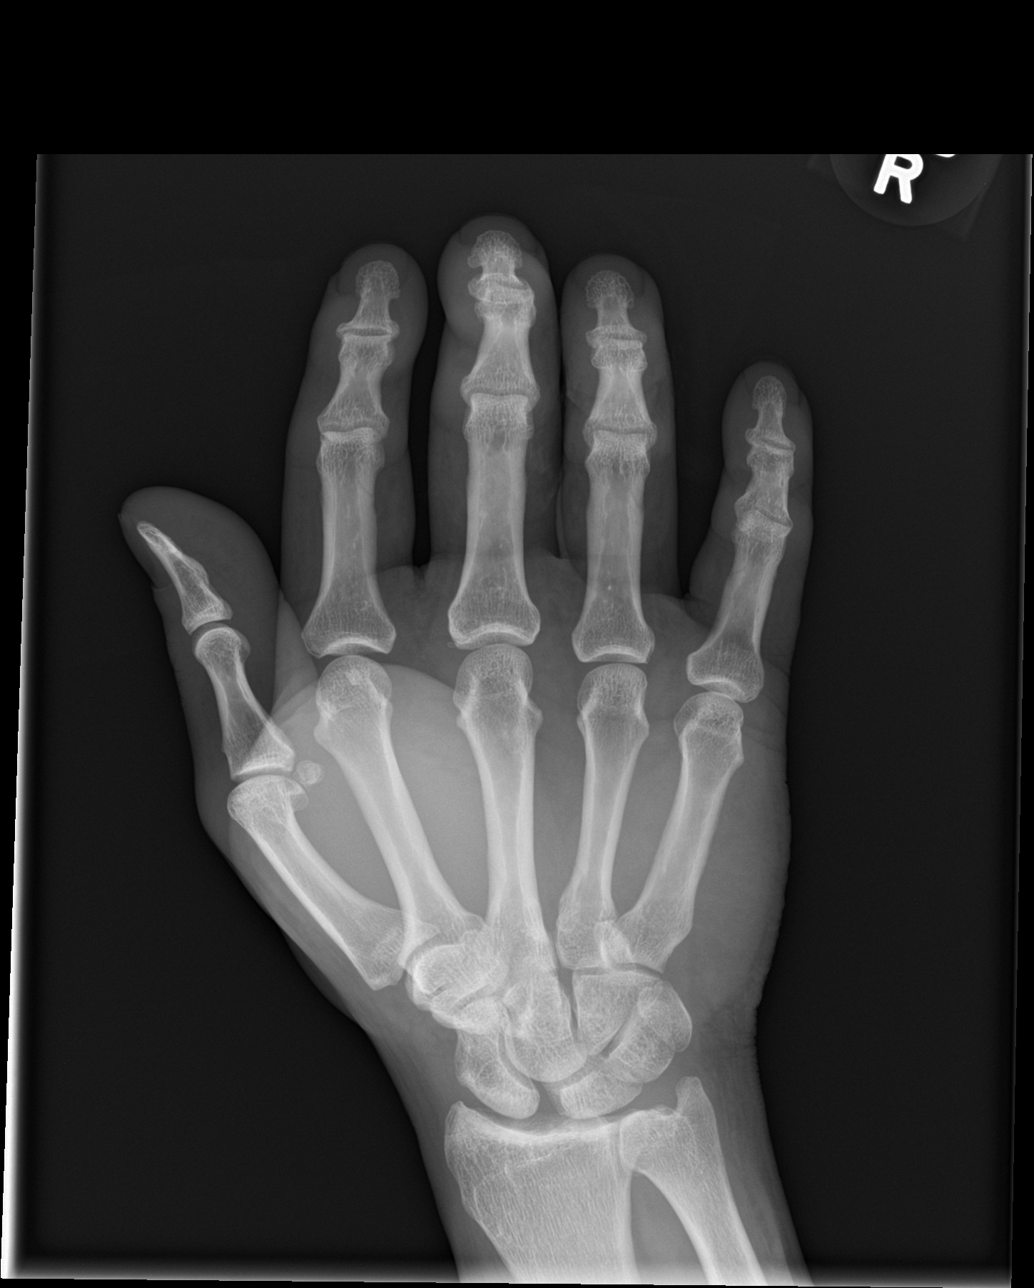

[hand obl]
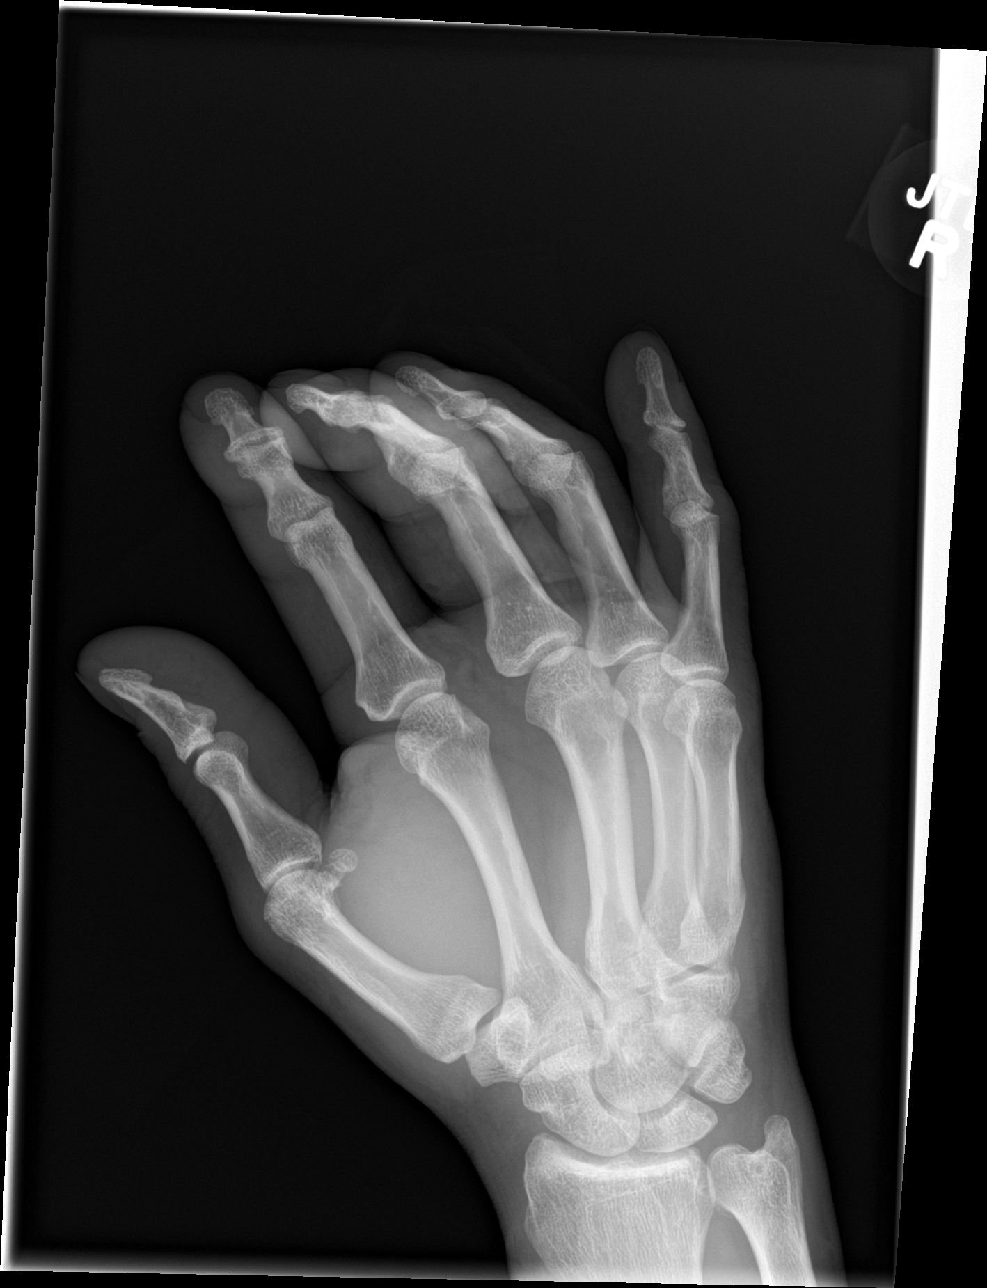

[hand lat]
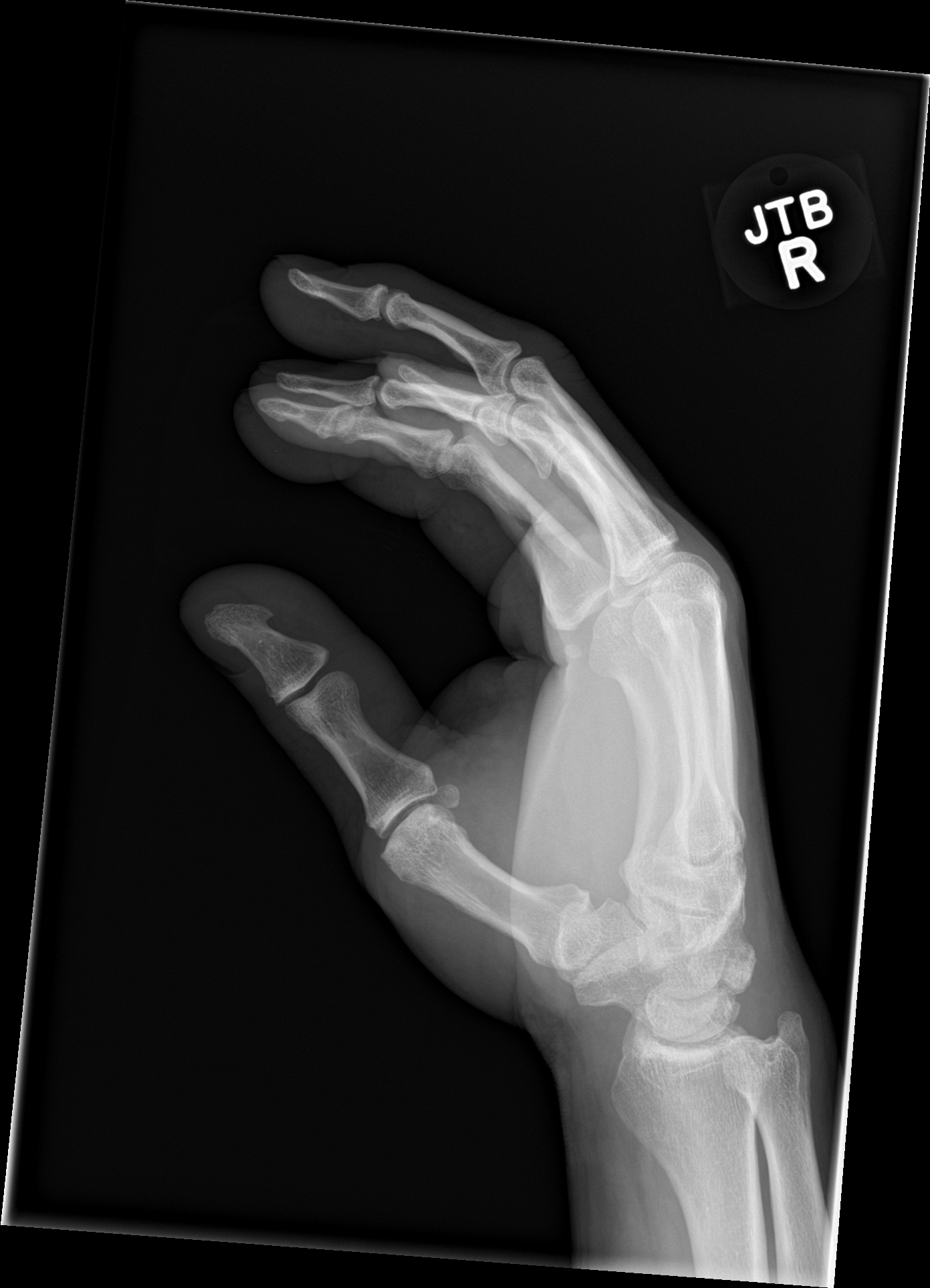

[hand pa (2 of 2)]
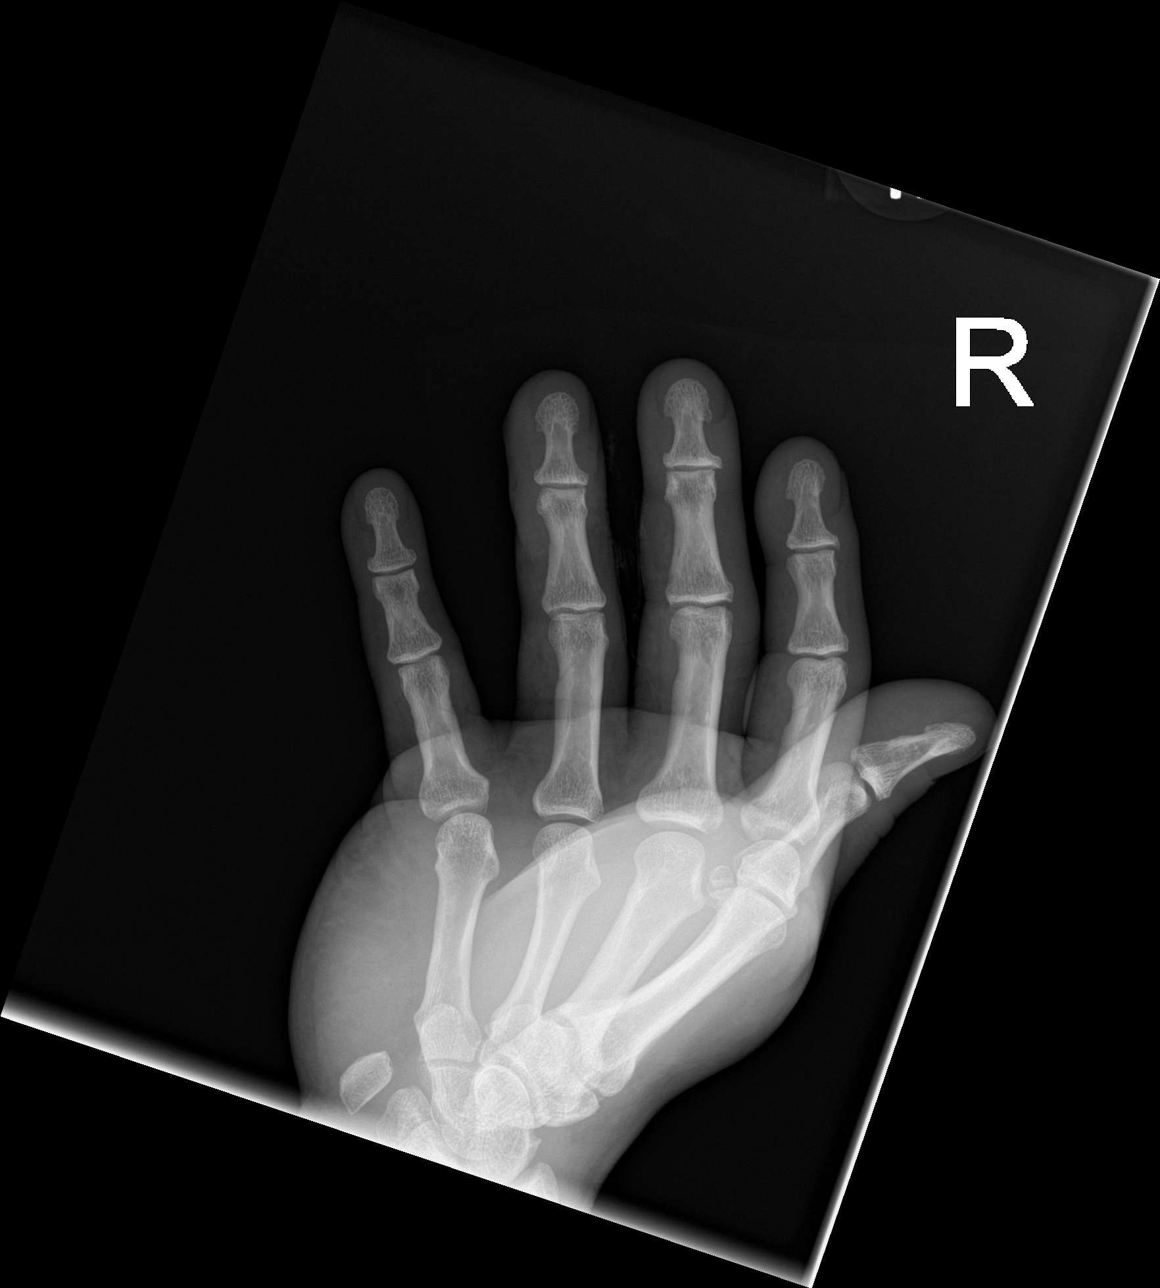

[4 of 4 positions shown; findings below may reference images not displayed]

FINDINGS: There is no evidence of fracture or dislocation. There is no
evidence of arthropathy or other focal bone abnormality. Soft
tissues are unremarkable.
IMPRESSION: Negative.

## 2022-03-06 ENCOUNTER — Emergency Department (HOSPITAL_COMMUNITY): Payer: PRIVATE HEALTH INSURANCE

## 2022-03-06 ENCOUNTER — Emergency Department (HOSPITAL_COMMUNITY)
Admission: EM | Admit: 2022-03-06 | Discharge: 2022-03-06 | Payer: PRIVATE HEALTH INSURANCE | Attending: Emergency Medicine | Admitting: Emergency Medicine

## 2022-03-06 ENCOUNTER — Other Ambulatory Visit: Payer: Self-pay

## 2022-03-06 ENCOUNTER — Encounter (HOSPITAL_COMMUNITY): Payer: Self-pay

## 2022-03-06 DIAGNOSIS — F1193 Opioid use, unspecified with withdrawal: Secondary | ICD-10-CM

## 2022-03-06 DIAGNOSIS — E1165 Type 2 diabetes mellitus with hyperglycemia: Secondary | ICD-10-CM | POA: Diagnosis not present

## 2022-03-06 DIAGNOSIS — R000 Tachycardia, unspecified: Secondary | ICD-10-CM | POA: Insufficient documentation

## 2022-03-06 DIAGNOSIS — F1113 Opioid abuse with withdrawal: Secondary | ICD-10-CM | POA: Insufficient documentation

## 2022-03-06 DIAGNOSIS — R0602 Shortness of breath: Secondary | ICD-10-CM | POA: Insufficient documentation

## 2022-03-06 DIAGNOSIS — Z794 Long term (current) use of insulin: Secondary | ICD-10-CM | POA: Insufficient documentation

## 2022-03-06 DIAGNOSIS — R739 Hyperglycemia, unspecified: Secondary | ICD-10-CM

## 2022-03-06 LAB — BASIC METABOLIC PANEL WITH GFR
Anion gap: 9 (ref 5–15)
BUN: 20 mg/dL (ref 6–20)
CO2: 23 mmol/L (ref 22–32)
Calcium: 8.7 mg/dL — ABNORMAL LOW (ref 8.9–10.3)
Chloride: 105 mmol/L (ref 98–111)
Creatinine, Ser: 0.83 mg/dL (ref 0.61–1.24)
GFR, Estimated: >60 mL/min
Glucose, Bld: 257 mg/dL — ABNORMAL HIGH (ref 70–99)
Potassium: 3.5 mmol/L (ref 3.5–5.1)
Sodium: 137 mmol/L (ref 135–145)

## 2022-03-06 LAB — CBC WITH DIFFERENTIAL/PLATELET
Abs Immature Granulocytes: 0.04 K/uL (ref 0.00–0.07)
Basophils Absolute: 0 K/uL (ref 0.0–0.1)
Basophils Relative: 0 %
Eosinophils Absolute: 0 K/uL (ref 0.0–0.5)
Eosinophils Relative: 0 %
HCT: 52.8 % — ABNORMAL HIGH (ref 39.0–52.0)
Hemoglobin: 18.1 g/dL — ABNORMAL HIGH (ref 13.0–17.0)
Immature Granulocytes: 0 %
Lymphocytes Relative: 24 %
Lymphs Abs: 3.3 K/uL (ref 0.7–4.0)
MCH: 32.3 pg (ref 26.0–34.0)
MCHC: 34.3 g/dL (ref 30.0–36.0)
MCV: 94.3 fL (ref 80.0–100.0)
Monocytes Absolute: 0.9 K/uL (ref 0.1–1.0)
Monocytes Relative: 7 %
Neutro Abs: 9.7 K/uL — ABNORMAL HIGH (ref 1.7–7.7)
Neutrophils Relative %: 69 %
Platelets: 290 K/uL (ref 150–400)
RBC: 5.6 MIL/uL (ref 4.22–5.81)
RDW: 13.2 % (ref 11.5–15.5)
WBC: 14 K/uL — ABNORMAL HIGH (ref 4.0–10.5)
nRBC: 0 % (ref 0.0–0.2)

## 2022-03-06 LAB — MAGNESIUM: Magnesium: 2.5 mg/dL — ABNORMAL HIGH (ref 1.7–2.4)

## 2022-03-06 LAB — CBG MONITORING, ED: Glucose-Capillary: 296 mg/dL — ABNORMAL HIGH (ref 70–99)

## 2022-03-06 MED ORDER — ACETAMINOPHEN 325 MG PO TABS
650.0000 mg | ORAL_TABLET | Freq: Once | ORAL | Status: AC
  Administered 2022-03-06: 650 mg via ORAL
  Filled 2022-03-06: qty 2

## 2022-03-06 MED ORDER — ACETAMINOPHEN 500 MG PO TABS: 500.0000 mg | ORAL_TABLET | Freq: Four times a day (QID) | ORAL | 0 refills | Status: DC | PRN

## 2022-03-06 MED ORDER — ONDANSETRON HCL 4 MG PO TABS: 4.0000 mg | ORAL_TABLET | Freq: Four times a day (QID) | ORAL | 0 refills | Status: DC

## 2022-03-06 MED ORDER — LOPERAMIDE HCL 2 MG PO CAPS: 2.0000 mg | ORAL_CAPSULE | Freq: Four times a day (QID) | ORAL | 0 refills | Status: DC | PRN

## 2022-03-06 MED ORDER — ONDANSETRON HCL 4 MG/2ML IJ SOLN
4.0000 mg | Freq: Once | INTRAMUSCULAR | Status: AC
  Administered 2022-03-06: 4 mg via INTRAVENOUS
  Filled 2022-03-06: qty 2

## 2022-03-06 MED ORDER — CLONIDINE HCL 0.1 MG PO TABS
0.1000 mg | ORAL_TABLET | Freq: Once | ORAL | Status: AC
  Administered 2022-03-06: 0.1 mg via ORAL
  Filled 2022-03-06: qty 1

## 2022-03-06 MED ORDER — SODIUM CHLORIDE 0.9 % IV BOLUS
1000.0000 mL | Freq: Once | INTRAVENOUS | Status: AC
  Administered 2022-03-06: 1000 mL via INTRAVENOUS

## 2022-03-06 MED ORDER — IBUPROFEN 600 MG PO TABS: 600.0000 mg | ORAL_TABLET | Freq: Four times a day (QID) | ORAL | 0 refills | Status: DC | PRN

## 2022-03-06 MED ORDER — IBUPROFEN 200 MG PO TABS
600.0000 mg | ORAL_TABLET | Freq: Once | ORAL | Status: AC
  Administered 2022-03-06: 600 mg via ORAL
  Filled 2022-03-06: qty 3

## 2022-03-06 NOTE — ED Triage Notes (Signed)
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 Pt reports n/v/chills/bodyaches. Last used 2 days ago

## 2022-03-06 NOTE — Discharge Instructions (Addendum)
You were seen in the emergency department for your opioid withdrawal.  You had minor withdrawal symptoms on your exam and we treated you with medications to help with your withdrawal symptoms.  Your work-up showed that your blood sugar was slightly elevated but you had no complications from the high blood sugar.  You should follow-up with your primary doctor to have your blood sugar rechecked.  You can continue to take Tylenol or Motrin as needed for body aches associated with opioid withdrawal, Zofran as needed for nausea and vomiting and odium as needed for diarrhea.  Return to the emergency department if you are having any new or worsening symptoms.

## 2022-03-06 NOTE — ED Provider Notes (Signed)
[facility]-EMERGENCY DEPT Provider Note   CSN: [id number] Arrival date & time: 03/06/22  1717     History  Chief Complaint  Patient presents with   Withdrawal    Mike Small is a 56 y.o. male.  Patient is a 56 year old male with a past medical history of diabetes and opioid use disorder presenting to the emergency department in police custody for opioid withdrawal.  The patient was arrested and sent to jail 2 days ago and states he has not used opioids in the last 2 days.  He reports using heroin and fentanyl.  He states that he has been feeling nauseous and vomited 4 times today.  He states he has had abdominal cramping and loose stools.  He states he feels short of breath.  He denies any fevers.  He denies any other drug or alcohol use.  The history is provided by the patient.       Home Medications Prior to Admission medications   Medication Sig Start Date End Date Taking? Authorizing Provider  acetaminophen (TYLENOL) 500 MG tablet Take 1 tablet (500 mg total) by mouth every 6 (six) hours as needed. 03/06/22  Yes Shawnte Winton, Benetta Spar K, DO  ibuprofen (ADVIL) 600 MG tablet Take 1 tablet (600 mg total) by mouth every 6 (six) hours as needed. 03/06/22  Yes Zade Falkner, Cecile Sheerer, DO  loperamide (IMODIUM) 2 MG capsule Take 1 capsule (2 mg total) by mouth 4 (four) times daily as needed for diarrhea or loose stools. 03/06/22  Yes Kennita Pavlovich, [place] K, DO  ondansetron (ZOFRAN) 4 MG tablet Take 1 tablet (4 mg total) by mouth every 6 (six) hours. 03/06/22  Yes Mckynlee Luse, Cecile Sheerer, DO  acetaminophen (TYLENOL) 500 MG tablet Take 1,000 mg by mouth every 6 (six) hours as needed for mild pain.    [provider]  calcium carbonate (TUMS) 500 MG chewable tablet Chew 2 tablets (400 mg of elemental calcium total) by mouth 2 (two) times daily as needed for indigestion or heartburn. 10/13/17   Georgiana Shore, PA-C  dicyclomine (BENTYL) 20 MG tablet Take 1 tablet (20 mg  total) by mouth every 12 (twelve) hours as needed (abdominal pain/cramping). 10/11/17   Antony Madura, PA-C  gabapentin (NEURONTIN) 100 MG capsule Take 1 capsule (100 mg total) by mouth 2 (two) times daily. 11/16/13   Trixie Dredge, PA-C  HYDROcodone-acetaminophen (NORCO/VICODIN) 5-325 MG tablet Take 1 tablet by mouth every 4 (four) hours as needed. 03/27/20   Henderly, Britni A, PA-C  ibuprofen (ADVIL,MOTRIN) 600 MG tablet Take 1 tablet (600 mg total) by mouth every 6 (six) hours as needed. Patient not taking: Reported on 10/13/2017 10/25/16   Derwood Kaplan, MD  LANTUS SOLOSTAR 100 UNIT/ML Solostar Pen Inject 20 Units into the skin at bedtime. 08/15/17   [provider]  NOVOLIN N 100 UNIT/ML injection Inject 10 Units into the skin 2 (two) times daily. 08/24/17   [provider]  omeprazole (PRILOSEC) 20 MG capsule Take 1 capsule (20 mg total) by mouth daily. 10/13/17   Georgiana Shore, PA-C  promethazine (PHENERGAN) 25 MG tablet Take 1 tablet (25 mg total) by mouth every 6 (six) hours as needed for nausea or vomiting. 10/11/17   Antony Madura, PA-C  tetrahydrozoline-zinc (VISINE-AC) 0.05-0.25 % ophthalmic solution Place 1 drop into both eyes as needed (dry eyes).    [provider]      Allergies    Patient has no known allergies.    Review of  Systems   Review of Systems  Physical Exam Updated Vital Signs BP (!) 167/94 (BP Location: Left Arm)   Pulse 72   Temp 98.2 F (36.8 C) (Oral)   Resp 16   Ht 5\' 8"  (1.727 m)   Wt 95.3 kg   SpO2 97%   BMI 31.93 kg/m  Physical Exam Vitals and nursing note reviewed.  Constitutional:      General: He is not in acute distress.    Appearance: Normal appearance. He is not toxic-appearing or diaphoretic.     Comments: Drowsy but easily arousable by verbal stimulation Intermittently yawning throughout exam  HENT:     Head: Normocephalic and atraumatic.     Nose: Nose normal.     Mouth/Throat:     Mouth: Mucous membranes are  moist.     Pharynx: Oropharynx is clear.  Eyes:     Extraocular Movements: Extraocular movements intact.     Conjunctiva/sclera: Conjunctivae normal.     Pupils: Pupils are equal, round, and reactive to light.  Cardiovascular:     Rate and Rhythm: Regular rhythm. Tachycardia present.     Pulses: Normal pulses.     Heart sounds: Normal heart sounds.  Pulmonary:     Effort: Pulmonary effort is normal.     Breath sounds: Normal breath sounds.  Abdominal:     General: Abdomen is flat.     Palpations: Abdomen is soft.  Musculoskeletal:        General: Normal range of motion.     Cervical back: Normal range of motion and neck supple.  Skin:    General: Skin is warm and dry.     Comments: No piloerection  Neurological:     General: No focal deficit present.     Mental Status: He is oriented to person, place, and time.  Psychiatric:        Mood and Affect: Mood normal.        Behavior: Behavior normal.     ED Results / Procedures / Treatments   Labs (all labs ordered are listed, but only abnormal results are displayed) Labs Reviewed  CBC WITH DIFFERENTIAL/PLATELET - Abnormal; Notable for the following components:      Result Value   WBC 14.0 (*)    Hemoglobin 18.1 (*)    HCT 52.8 (*)    Neutro Abs 9.7 (*)    All other components within normal limits  BASIC METABOLIC PANEL - Abnormal; Notable for the following components:   Glucose, Bld 257 (*)    Calcium 8.7 (*)    All other components within normal limits  MAGNESIUM - Abnormal; Notable for the following components:   Magnesium 2.5 (*)    All other components within normal limits  CBG MONITORING, ED - Abnormal; Notable for the following components:   Glucose-Capillary 296 (*)    All other components within normal limits    EKG None  Radiology DG Chest 1 View  Result Date: 03/06/2022 CLINICAL DATA:  Medical clearance for withdrawal from daily heroin use; nausea, vomiting, chills, and body aches EXAM: CHEST  1 VIEW  COMPARISON:  Radiographs 02/01/2013 FINDINGS: No focal consolidation, pleural effusion, or pneumothorax. Normal cardiomediastinal silhouette. No acute osseous abnormality. IMPRESSION: No active disease. Electronically Signed   By: Placido Sou M.D.   On: 03/06/2022 21:21    Procedures Procedures    Medications Ordered in ED Medications  sodium chloride 0.9 % bolus 1,000 mL (0 mLs Intravenous Stopped 03/06/22 2045)  ondansetron (ZOFRAN)  injection 4 mg (4 mg Intravenous Given 03/06/22 1849)  cloNIDine (CATAPRES) tablet 0.1 mg (0.1 mg Oral Given 03/06/22 1850)  ibuprofen (ADVIL) tablet 600 mg (600 mg Oral Given 03/06/22 1850)  acetaminophen (TYLENOL) tablet 650 mg (650 mg Oral Given 03/06/22 1850)    ED Course/ Medical Decision Making/ A&P Clinical Course as of 03/06/22 2253  Sat Mar 06, 2022  2059 Upon reassessment, the patient is asleep in bed resting comfortably.  Vital signs have improved.  COWS score 0 at this time.  Labs are pending.  He will additionally have chest x-ray for complaints of shortness of breath. [VK]  2242 Labs show hyperglycemia but without signs of DKA.  He has a mildly elevated white count but no signs of infection on exam.  Chest x-ray is negative.  He is medically cleared for discharge back to police custody. [VK]  2243 Patient is alseep on exam resting comfortably with COWS 0. He will be given prescription for zofran, loperamide, Tylenol and Motrin as needed for symptoms of withdrawal [VK]    Clinical Course User Index [VK] Phoebe Sharps, DO                           Medical Decision Making This patient presents to the ED with chief complaint(s) of opioid withdrawal with pertinent past medical history of opioid use disorder, diabetes which further complicates the presenting complaint. The complaint involves an extensive differential diagnosis and also carries with it a high risk of complications and morbidity.    The differential diagnosis includes opioid  withdrawal, hyperglycemia, DKA, dehydration, electrolyte abnormality  Additional history obtained: Additional history obtained from N/A Records reviewed previous ED records  ED Course and Reassessment: Upon patient's arrival to the emergency department he has a COWS score of 7 concerning for mild opioid withdrawal.  He received points for yawning, subjective chills, tachycardia and reports of vomiting and diarrhea.  He has not been vomiting or dry heaving while in the emergency department.  He will be given IV fluids and he will have labs to evaluate for signs of dehydration.  He will be symptomatically treated with Tylenol, Motrin, Zofran and clonidine and will be reassessed.  Independent labs interpretation:  The following labs were independently interpreted: hyperglycemia, otherwise within normal range  Independent visualization of imaging: - I independently visualized the following imaging with scope of interpretation limited to determining acute life threatening conditions related to emergency care: CXR, which revealed no acute disease  Consultation: - Consulted or discussed management/test interpretation w/ external professional: N/A  Consideration for admission or further workup: Patient has no emergent conditions requiring admission at this time.  He is stable for discharge home with outpatient primary care and substance use follow-up Social Determinants of health: Opioid use disorder, in police custody    Amount and/or Complexity of Data Reviewed Labs: ordered. Radiology: ordered.  Risk OTC drugs. Prescription drug management.          Final Clinical Impression(s) / ED Diagnoses Final diagnoses:  Narcotic withdrawal (HCC)  Hyperglycemia    Rx / DC Orders ED Discharge Orders          Ordered    acetaminophen (TYLENOL) 500 MG tablet  Every 6 hours PRN        03/06/22 2247    ibuprofen (ADVIL) 600 MG tablet  Every 6 hours PRN        03/06/22 2247     ondansetron (ZOFRAN) 4  MG tablet  Every 6 hours        03/06/22 2247    loperamide (IMODIUM) 2 MG capsule  4 times daily PRN        03/06/22 2247              Isaish Alemu, [place] K, DO 03/06/22 2253

## 2022-03-06 NOTE — ED Notes (Signed)
Patient transported back to jail with officer at this time

## 2022-08-25 ENCOUNTER — Emergency Department (HOSPITAL_COMMUNITY): Payer: Medicaid Other

## 2022-08-25 ENCOUNTER — Other Ambulatory Visit: Payer: Self-pay

## 2022-08-25 ENCOUNTER — Inpatient Hospital Stay (HOSPITAL_COMMUNITY)
Admission: EM | Admit: 2022-08-25 | Discharge: 2022-08-27 | DRG: 917 | Discharge status: Against Medical Advice | Payer: Medicaid Other | Attending: Family Medicine | Admitting: Family Medicine

## 2022-08-25 DIAGNOSIS — E1165 Type 2 diabetes mellitus with hyperglycemia: Secondary | ICD-10-CM | POA: Diagnosis present

## 2022-08-25 DIAGNOSIS — Z91148 Patient's other noncompliance with medication regimen for other reason: Secondary | ICD-10-CM

## 2022-08-25 DIAGNOSIS — F1410 Cocaine abuse, uncomplicated: Secondary | ICD-10-CM | POA: Diagnosis present

## 2022-08-25 DIAGNOSIS — F1910 Other psychoactive substance abuse, uncomplicated: Secondary | ICD-10-CM

## 2022-08-25 DIAGNOSIS — J690 Pneumonitis due to inhalation of food and vomit: Secondary | ICD-10-CM | POA: Diagnosis present

## 2022-08-25 DIAGNOSIS — R0989 Other specified symptoms and signs involving the circulatory and respiratory systems: Secondary | ICD-10-CM

## 2022-08-25 DIAGNOSIS — R0682 Tachypnea, not elsewhere classified: Secondary | ICD-10-CM

## 2022-08-25 DIAGNOSIS — R079 Chest pain, unspecified: Secondary | ICD-10-CM

## 2022-08-25 DIAGNOSIS — Z794 Long term (current) use of insulin: Secondary | ICD-10-CM

## 2022-08-25 DIAGNOSIS — F17290 Nicotine dependence, other tobacco product, uncomplicated: Secondary | ICD-10-CM | POA: Diagnosis present

## 2022-08-25 DIAGNOSIS — Z79899 Other long term (current) drug therapy: Secondary | ICD-10-CM

## 2022-08-25 DIAGNOSIS — N179 Acute kidney failure, unspecified: Secondary | ICD-10-CM | POA: Diagnosis present

## 2022-08-25 DIAGNOSIS — J189 Pneumonia, unspecified organism: Secondary | ICD-10-CM | POA: Diagnosis present

## 2022-08-25 DIAGNOSIS — R1110 Vomiting, unspecified: Secondary | ICD-10-CM | POA: Diagnosis present

## 2022-08-25 DIAGNOSIS — E1140 Type 2 diabetes mellitus with diabetic neuropathy, unspecified: Secondary | ICD-10-CM | POA: Diagnosis present

## 2022-08-25 DIAGNOSIS — F1110 Opioid abuse, uncomplicated: Secondary | ICD-10-CM | POA: Diagnosis present

## 2022-08-25 DIAGNOSIS — Z1152 Encounter for screening for COVID-19: Secondary | ICD-10-CM

## 2022-08-25 DIAGNOSIS — E872 Acidosis, unspecified: Secondary | ICD-10-CM | POA: Diagnosis present

## 2022-08-25 DIAGNOSIS — T405X1A Poisoning by cocaine, accidental (unintentional), initial encounter: Principal | ICD-10-CM | POA: Diagnosis present

## 2022-08-25 DIAGNOSIS — R000 Tachycardia, unspecified: Secondary | ICD-10-CM

## 2022-08-25 DIAGNOSIS — R112 Nausea with vomiting, unspecified: Secondary | ICD-10-CM

## 2022-08-25 DIAGNOSIS — Z5329 Procedure and treatment not carried out because of patient's decision for other reasons: Secondary | ICD-10-CM | POA: Diagnosis present

## 2022-08-25 DIAGNOSIS — E873 Alkalosis: Secondary | ICD-10-CM | POA: Diagnosis present

## 2022-08-25 DIAGNOSIS — Z91199 Patient's noncompliance with other medical treatment and regimen due to unspecified reason: Secondary | ICD-10-CM

## 2022-08-25 DIAGNOSIS — R1084 Generalized abdominal pain: Secondary | ICD-10-CM

## 2022-08-25 DIAGNOSIS — E119 Type 2 diabetes mellitus without complications: Secondary | ICD-10-CM

## 2022-08-25 HISTORY — DX: Type 2 diabetes mellitus without complications: E11.9

## 2022-08-25 LAB — CBC
HCT: 45.4 % (ref 39.0–52.0)
Hemoglobin: 15.6 g/dL (ref 13.0–17.0)
MCH: 31.8 pg (ref 26.0–34.0)
MCHC: 34.4 g/dL (ref 30.0–36.0)
MCV: 92.7 fL (ref 80.0–100.0)
Platelets: 248 10*3/uL (ref 150–400)
RBC: 4.9 MIL/uL (ref 4.22–5.81)
RDW: 13.2 % (ref 11.5–15.5)
WBC: 7.7 10*3/uL (ref 4.0–10.5)
nRBC: 0 % (ref 0.0–0.2)

## 2022-08-25 LAB — BLOOD GAS, ARTERIAL
Acid-base deficit: 1.2 mmol/L (ref 0.0–2.0)
Bicarbonate: 18.8 mmol/L — ABNORMAL LOW (ref 20.0–28.0)
FIO2: 21 %
O2 Saturation: 95.9 %
Patient temperature: 37.1
pCO2 arterial: 22 mmHg — ABNORMAL LOW (ref 32–48)
pH, Arterial: 7.54 — ABNORMAL HIGH (ref 7.35–7.45)
pO2, Arterial: 66 mmHg — ABNORMAL LOW (ref 83–108)

## 2022-08-25 LAB — BASIC METABOLIC PANEL
Anion gap: 15 (ref 5–15)
BUN: 11 mg/dL (ref 6–20)
CO2: 22 mmol/L (ref 22–32)
Calcium: 9.2 mg/dL (ref 8.9–10.3)
Chloride: 97 mmol/L — ABNORMAL LOW (ref 98–111)
Creatinine, Ser: 0.71 mg/dL (ref 0.61–1.24)
GFR, Estimated: >60 mL/min (ref >60)
Glucose, Bld: 253 mg/dL — ABNORMAL HIGH (ref 70–99)
Potassium: 3.5 mmol/L (ref 3.5–5.1)
Sodium: 134 mmol/L — ABNORMAL LOW (ref 135–145)

## 2022-08-25 LAB — URINALYSIS, ROUTINE W REFLEX MICROSCOPIC
Bacteria, UA: NONE SEEN
Bilirubin Urine: NEGATIVE
Glucose, UA: >=500 mg/dL — AB
Ketones, ur: 80 mg/dL — AB
Leukocytes,Ua: NEGATIVE
Nitrite: NEGATIVE
Protein, ur: 30 mg/dL — AB
Specific Gravity, Urine: 1.026 (ref 1.005–1.030)
pH: 5 (ref 5.0–8.0)

## 2022-08-25 LAB — COMPREHENSIVE METABOLIC PANEL
ALT: 26 U/L (ref 0–44)
AST: 24 U/L (ref 15–41)
Albumin: 4.2 g/dL (ref 3.5–5.0)
Alkaline Phosphatase: 77 U/L (ref 38–126)
Anion gap: 17 — ABNORMAL HIGH (ref 5–15)
BUN: 9 mg/dL (ref 6–20)
CO2: 21 mmol/L — ABNORMAL LOW (ref 22–32)
Calcium: 9.1 mg/dL (ref 8.9–10.3)
Chloride: 97 mmol/L — ABNORMAL LOW (ref 98–111)
Creatinine, Ser: 0.82 mg/dL (ref 0.61–1.24)
GFR, Estimated: >60 mL/min (ref >60)
Glucose, Bld: 262 mg/dL — ABNORMAL HIGH (ref 70–99)
Potassium: 3.6 mmol/L (ref 3.5–5.1)
Sodium: 135 mmol/L (ref 135–145)
Total Bilirubin: 1.5 mg/dL — ABNORMAL HIGH (ref 0.3–1.2)
Total Protein: 8.1 g/dL (ref 6.5–8.1)

## 2022-08-25 LAB — CBG MONITORING, ED
Glucose-Capillary: 222 mg/dL — ABNORMAL HIGH (ref 70–99)
Glucose-Capillary: 249 mg/dL — ABNORMAL HIGH (ref 70–99)

## 2022-08-25 LAB — ETHANOL: Alcohol, Ethyl (B): <10 mg/dL (ref <10)

## 2022-08-25 LAB — MAGNESIUM: Magnesium: 2.1 mg/dL (ref 1.7–2.4)

## 2022-08-25 LAB — TROPONIN I (HIGH SENSITIVITY)
Troponin I (High Sensitivity): 13 ng/L (ref <18)
Troponin I (High Sensitivity): 17 ng/L (ref <18)

## 2022-08-25 LAB — LIPASE, BLOOD: Lipase: 42 U/L (ref 11–51)

## 2022-08-25 MED ORDER — SODIUM CHLORIDE 0.9 % IV SOLN
12.5000 mg | INTRAVENOUS | Status: AC
  Filled 2022-08-25: qty 0.5

## 2022-08-25 MED ORDER — ONDANSETRON 4 MG PO TBDP: 4.0000 mg | ORAL_TABLET | Freq: Three times a day (TID) | ORAL | 0 refills | Status: DC | PRN

## 2022-08-25 MED ORDER — IOHEXOL 350 MG/ML SOLN
100.0000 mL | Freq: Once | INTRAVENOUS | Status: AC | PRN
  Administered 2022-08-25: 100 mL via INTRAVENOUS

## 2022-08-25 MED ORDER — MIDAZOLAM HCL 2 MG/2ML IJ SOLN
2.0000 mg | Freq: Once | INTRAMUSCULAR | Status: DC
  Filled 2022-08-25: qty 2

## 2022-08-25 MED ORDER — INSULIN ASPART 100 UNIT/ML IJ SOLN
0.0000 [IU] | Freq: Every day | INTRAMUSCULAR | Status: DC
  Administered 2022-08-25: 2 [IU] via SUBCUTANEOUS
  Filled 2022-08-25: qty 0.05

## 2022-08-25 MED ORDER — INSULIN ASPART 100 UNIT/ML IJ SOLN
9.0000 [IU] | Freq: Once | INTRAMUSCULAR | Status: AC
  Administered 2022-08-25: 9 [IU] via SUBCUTANEOUS
  Filled 2022-08-25: qty 0.09

## 2022-08-25 MED ORDER — MIDAZOLAM HCL 2 MG/2ML IJ SOLN
2.0000 mg | Freq: Once | INTRAMUSCULAR | Status: AC
  Administered 2022-08-25: 2 mg via INTRAVENOUS
  Filled 2022-08-25: qty 2

## 2022-08-25 MED ORDER — DIPHENHYDRAMINE HCL 25 MG PO CAPS
25.0000 mg | ORAL_CAPSULE | Freq: Once | ORAL | Status: AC
  Administered 2022-08-25: 25 mg via ORAL
  Filled 2022-08-25: qty 1

## 2022-08-25 MED ORDER — LACTATED RINGERS IV BOLUS
1000.0000 mL | Freq: Once | INTRAVENOUS | Status: AC
  Administered 2022-08-25: 1000 mL via INTRAVENOUS

## 2022-08-25 MED ORDER — ACETAMINOPHEN 500 MG PO TABS
1000.0000 mg | ORAL_TABLET | Freq: Once | ORAL | Status: AC
  Administered 2022-08-25: 1000 mg via ORAL
  Filled 2022-08-25: qty 2

## 2022-08-25 MED ORDER — ONDANSETRON 4 MG PO TBDP
4.0000 mg | ORAL_TABLET | Freq: Once | ORAL | Status: AC
  Administered 2022-08-25: 4 mg via ORAL
  Filled 2022-08-25: qty 1

## 2022-08-25 MED ORDER — ACETAMINOPHEN 500 MG PO TABS: 1000.0000 mg | ORAL_TABLET | Freq: Four times a day (QID) | ORAL | 0 refills | Status: DC | PRN

## 2022-08-25 MED ORDER — METOCLOPRAMIDE HCL 5 MG/ML IJ SOLN
10.0000 mg | Freq: Once | INTRAMUSCULAR | Status: AC
  Administered 2022-08-25: 10 mg via INTRAVENOUS
  Filled 2022-08-25: qty 2

## 2022-08-25 MED ORDER — INSULIN ASPART 100 UNIT/ML IJ SOLN
0.0000 [IU] | Freq: Three times a day (TID) | INTRAMUSCULAR | Status: DC
  Filled 2022-08-25: qty 0.15

## 2022-08-25 NOTE — Discharge Instructions (Addendum)
Please stop using heroin and cocaine.  Your workup today was reassuringly without any abnormal findings.  Make sure you hydrate, continue taking your insulin which I have refilled for you.  Follow-up with your primary care doctor return to the emergency room for any new or concerning symptoms.  Followup with cardiology given that you have some changes on your EKG -- these can be addressed outpatient.

## 2022-08-25 NOTE — ED Provider Notes (Signed)
[place] [facility]ERGENCY DEPARTMENT AT [facility] Provider Note   CSN: [id number] Arrival date & time: 08/25/22  1150     History  Chief Complaint  Patient presents with   Nasal Congestion   Chest Pain   Nausea    GREY TURKNETT is a 57 y.o. male.   Chest Pain Patient is a 57 year old male with a past medical history significant for DM2 and noncompliance with his insulin and also history of polysubstance abuse  He states that he is here in the emergency room today because of chest pain and abdominal pain.  He describes these areas of pain as sharp and nonradiating and began at 5 AM this morning approximately 5 or 6 hours after he snorted cocaine.  He also snorted heroin today.  He denies any syncope or near syncope.  States that he has been coughing and congested for the past 3 days.  He has been vomiting frequently since his symptoms began.  Nonbloody nonbilious emesis.  Denies any etoh use.      Home Medications Prior to Admission medications   Medication Sig Start Date End Date Taking? Authorizing Provider  acetaminophen (TYLENOL) 500 MG tablet Take 2 tablets (1,000 mg total) by mouth every 6 (six) hours as needed. 08/25/22  Yes Janaya Broy S, PA  ondansetron (ZOFRAN-ODT) 4 MG disintegrating tablet Take 1 tablet (4 mg total) by mouth every 8 (eight) hours as needed for nausea or vomiting. 08/25/22  Yes Cythia Bachtel, Kathleene Hazel, PA  calcium carbonate (TUMS) 500 MG chewable tablet Chew 2 tablets (400 mg of elemental calcium total) by mouth 2 (two) times daily as needed for indigestion or heartburn. 10/13/17   Emeline General, PA-C  dicyclomine (BENTYL) 20 MG tablet Take 1 tablet (20 mg total) by mouth every 12 (twelve) hours as needed (abdominal pain/cramping). 10/11/17   Antonietta Breach, PA-C  gabapentin (NEURONTIN) 100 MG capsule Take 1 capsule (100 mg total) by mouth 2 (two) times daily. 11/16/13   Clayton Bibles, PA-C  HYDROcodone-acetaminophen (NORCO/VICODIN) 5-325 MG tablet  Take 1 tablet by mouth every 4 (four) hours as needed. 03/27/20   Henderly, Britni A, PA-C  ibuprofen (ADVIL) 600 MG tablet Take 1 tablet (600 mg total) by mouth every 6 (six) hours as needed. 03/06/22   Leanord Asal K, DO  ibuprofen (ADVIL,MOTRIN) 600 MG tablet Take 1 tablet (600 mg total) by mouth every 6 (six) hours as needed. Patient not taking: Reported on 10/13/2017 10/25/16   Varney Biles, MD  LANTUS SOLOSTAR 100 UNIT/ML Solostar Pen Inject 20 Units into the skin at bedtime. 08/15/17   [provider]  loperamide (IMODIUM) 2 MG capsule Take 1 capsule (2 mg total) by mouth 4 (four) times daily as needed for diarrhea or loose stools. 03/06/22   Leanord Asal K, DO  NOVOLIN N 100 UNIT/ML injection Inject 10 Units into the skin 2 (two) times daily. 08/24/17   [provider]  omeprazole (PRILOSEC) 20 MG capsule Take 1 capsule (20 mg total) by mouth daily. 10/13/17   Avie Echevaria B, PA-C  ondansetron (ZOFRAN) 4 MG tablet Take 1 tablet (4 mg total) by mouth every 6 (six) hours. 03/06/22   Kemper Durie, DO  promethazine (PHENERGAN) 25 MG tablet Take 1 tablet (25 mg total) by mouth every 6 (six) hours as needed for nausea or vomiting. 10/11/17   Antonietta Breach, PA-C  tetrahydrozoline-zinc (VISINE-AC) 0.05-0.25 % ophthalmic solution Place 1 drop into both eyes as needed (dry eyes).  [provider]      Allergies    Patient has no known allergies.    Review of Systems   Review of Systems  Cardiovascular:  Positive for chest pain.    Physical Exam Updated Vital Signs BP (!) 177/95   Pulse 66   Temp 98.2 F (36.8 C) (Oral)   Resp (!) 29   Wt 95 kg   SpO2 94%   BMI 31.84 kg/m  Physical Exam Vitals and nursing note reviewed.  Constitutional:      General: He is in acute distress.     Appearance: He is not toxic-appearing.  HENT:     Head: Normocephalic and atraumatic.     Nose: Nose normal.  Eyes:     General: No scleral  icterus. Cardiovascular:     Rate and Rhythm: Normal rate and regular rhythm.     Pulses: Normal pulses.     Heart sounds: Normal heart sounds.  Pulmonary:     Effort: Pulmonary effort is normal. No respiratory distress.     Breath sounds: No wheezing.  Abdominal:     Palpations: Abdomen is soft.     Tenderness: There is no abdominal tenderness. There is no guarding or rebound.  Musculoskeletal:     Cervical back: Normal range of motion.     Right lower leg: No edema.     Left lower leg: No edema.  Skin:    General: Skin is warm and dry.     Capillary Refill: Capillary refill takes less than 2 seconds.  Neurological:     Mental Status: He is alert. Mental status is at baseline.  Psychiatric:        Mood and Affect: Mood normal.        Behavior: Behavior normal.     ED Results / Procedures / Treatments   Labs (all labs ordered are listed, but only abnormal results are displayed) Labs Reviewed  URINALYSIS, ROUTINE W REFLEX MICROSCOPIC - Abnormal; Notable for the following components:      Result Value   Glucose, UA >=500 (*)    Hgb urine dipstick SMALL (*)    Ketones, ur 80 (*)    Protein, ur 30 (*)    All other components within normal limits  COMPREHENSIVE METABOLIC PANEL - Abnormal; Notable for the following components:   Chloride 97 (*)    CO2 21 (*)    Glucose, Bld 262 (*)    Total Bilirubin 1.5 (*)    Anion gap 17 (*)    All other components within normal limits  BLOOD GAS, ARTERIAL - Abnormal; Notable for the following components:   pH, Arterial 7.54 (*)    pCO2 arterial 22 (*)    pO2, Arterial 66 (*)    Bicarbonate 18.8 (*)    All other components within normal limits  BASIC METABOLIC PANEL - Abnormal; Notable for the following components:   Sodium 134 (*)    Chloride 97 (*)    Glucose, Bld 253 (*)    All other components within normal limits  CBG MONITORING, ED - Abnormal; Notable for the following components:   Glucose-Capillary 222 (*)    All other  components within normal limits  CBC  LIPASE, BLOOD  MAGNESIUM  TROPONIN I (HIGH SENSITIVITY)  TROPONIN I (HIGH SENSITIVITY)    EKG EKG Interpretation  Date/Time:  Wednesday August 25 2022 12:03:20 EDT Ventricular Rate:  67 PR Interval:  136 QRS Duration: 116 QT Interval:  446 QTC Calculation:  471 R Axis:   46 Text Interpretation: Sinus rhythm Atrial premature complex Nonspecific intraventricular conduction delay Nonspecific t wave inversions inferior leads Confirmed by Octaviano Glow [phone]) on 08/25/2022 1:05:01 PM  Radiology CT Angio Chest/Abd/Pel for Dissection W and/or W/WO  Result Date: 08/25/2022 CLINICAL DATA:  Acute aortic syndrome. EXAM: CT ANGIOGRAPHY CHEST, ABDOMEN AND PELVIS TECHNIQUE: Non-contrast CT of the chest was initially obtained. Multidetector CT imaging through the chest, abdomen and pelvis was performed using the standard protocol during bolus administration of intravenous contrast. Multiplanar reconstructed images and MIPs were obtained and reviewed to evaluate the vascular anatomy. RADIATION DOSE REDUCTION: This exam was performed according to the departmental dose-optimization program which includes automated exposure control, adjustment of the mA and/or kV according to patient size and/or use of iterative reconstruction technique. CONTRAST:  133m OMNIPAQUE IOHEXOL 350 MG/ML SOLN COMPARISON:  Abdomen and pelvis CT 10/11/2017 FINDINGS: CTA CHEST FINDINGS Cardiovascular: The heart size is normal. No substantial pericardial effusion. Motion degraded study. No evidence for thoracic aortic aneurysm. No dissection flap discernible within the thoracic aortic given the motion degradation. Mediastinum/Nodes: No mediastinal lymphadenopathy. There is no hilar lymphadenopathy. The esophagus has normal imaging features. There is no axillary lymphadenopathy. Lungs/Pleura: Centrilobular and paraseptal emphysema evident. Fine architectural detail of lung parenchyma is obscured by  breathing motion. Within this limitation no overtly suspicious nodule or mass is evident although tiny pulmonary nodules could be obscured. No evidence for pneumothorax. No focal airspace consolidation. There is no evidence of pleural effusion. Musculoskeletal: No worrisome lytic or sclerotic osseous abnormality. Substantial motion artifact noted through the sternum and thoracic spine limiting assessment. Review of the MIP images confirms the above findings. CTA ABDOMEN AND PELVIS FINDINGS VASCULAR Aorta: Normal caliber aorta without aneurysm, dissection, vasculitis or significant stenosis. Celiac: Patent without evidence of aneurysm, dissection, vasculitis or significant stenosis. SMA: Patent without evidence of aneurysm, dissection, vasculitis or significant stenosis. Renals: Both renal arteries are patent without evidence of aneurysm, dissection, vasculitis, fibromuscular dysplasia or significant stenosis. Accessory renal arteries could be obscured by motion artifact. IMA: Patent without evidence of aneurysm, dissection, vasculitis or significant stenosis. Inflow: Patent without evidence of aneurysm, dissection, vasculitis or significant stenosis. Veins: No obvious venous abnormality within the limitations of this arterial phase study. Review of the MIP images confirms the above findings. NON-VASCULAR Hepatobiliary: No suspicious focal abnormality within the liver parenchyma. There is no evidence for gallstones, gallbladder wall thickening, or pericholecystic fluid. No intrahepatic or extrahepatic biliary dilation. Pancreas: No focal mass lesion. No dilatation of the main duct. No intraparenchymal cyst. No peripancreatic edema. Spleen: No splenomegaly. No focal mass lesion. Adrenals/Urinary Tract: No adrenal nodule or mass. Kidneys unremarkable. No evidence for hydroureter. The urinary bladder appears normal for the degree of distention. Stomach/Bowel: Stomach is distended with fluid and gas. Duodenum is normally  positioned as is the ligament of Treitz. No small bowel wall thickening. No small bowel dilatation. Fine detail of small bowel and colon is obscured by motion artifact. No colonic dilatation. Lymphatic: No evidence for abdominal or pelvic lymphadenopathy Reproductive: The prostate gland and seminal vesicles are unremarkable. Other: No intraperitoneal free fluid. Musculoskeletal: No acute or significant osseous findings. Review of the MIP images confirms the above findings. IMPRESSION: 1. Motion degraded study. 2. No evidence for thoracoabdominal aortic aneurysm. No evidence for thoracoabdominal aortic dissection. 3. No acute findings in the chest, abdomen, or pelvis. 4.  Emphysema (ICD10-J43.9). Electronically Signed   By: EMisty StanleyM.D.   On: 08/25/2022 20:19  DG Chest Portable 1 View  Result Date: 08/25/2022 CLINICAL DATA:  Chest pain EXAM: PORTABLE CHEST 1 VIEW COMPARISON:  Chest x-ray 03/06/2022 FINDINGS: The aorta appears ectatic. Heart size is within normal limits. The lungs are clear. There is no pleural effusion or pneumothorax. No acute fractures are seen. IMPRESSION: 1. No active disease. 2. Ectatic aorta. Electronically Signed   By: Ronney Asters M.D.   On: 08/25/2022 15:11    Procedures Procedures    Medications Ordered in ED Medications  promethazine (PHENERGAN) 12.5 mg in sodium chloride 0.9 % 50 mL IVPB (12.5 mg Intravenous Not Given 08/25/22 2222)  ondansetron (ZOFRAN-ODT) disintegrating tablet 4 mg (4 mg Oral Given 08/25/22 1232)  acetaminophen (TYLENOL) tablet 1,000 mg (1,000 mg Oral Given 08/25/22 1232)  lactated ringers bolus 1,000 mL (0 mLs Intravenous Stopped 08/25/22 1921)  insulin aspart (novoLOG) injection 9 Units (9 Units Subcutaneous Given 08/25/22 1444)  midazolam (VERSED) injection 2 mg (2 mg Intravenous Given 08/25/22 1640)  iohexol (OMNIPAQUE) 350 MG/ML injection 100 mL (100 mLs Intravenous Contrast Given 08/25/22 2002)  metoCLOPramide (REGLAN) injection 10 mg (10 mg  Intravenous Given 08/25/22 2104)  diphenhydrAMINE (BENADRYL) capsule 25 mg (25 mg Oral Given 08/25/22 2104)    ED Course/ Medical Decision Making/ A&P Clinical Course as of 08/25/22 2230  Wed Aug 25, 2022  1221 CP and abd pain that is constant and sharp non-radiating since 5am this morning approx 5-6 hours after he insufflated cocaine. Also did heroin today.   3 days of cough congestion as well.  [WF]  2025 IMPRESSION: 1. Motion degraded study. 2. No evidence for thoracoabdominal aortic aneurysm. No evidence for thoracoabdominal aortic dissection. 3. No acute findings in the chest, abdomen, or pelvis. 4.  Emphysema (ICD10-J43.9).   Electronically Signed   By: Misty Stanley M.D.   On: 08/25/2022 20:19   [WF]    Clinical Course User Index [WF] Tedd Sias, PA                             Medical Decision Making Amount and/or Complexity of Data Reviewed Labs: ordered. Radiology: ordered.  Risk OTC drugs. Prescription drug management.   This patient presents to the ED for concern of CP/Abd pain, this involves a number of treatment options, and is a complaint that carries with it a high risk of complications and morbidity. A differential diagnosis was considered for the patient's symptoms which is discussed below:   The causes of generalized abdominal pain include but are not limited to AAA, mesenteric ischemia, appendicitis, diverticulitis, DKA, gastritis, gastroenteritis, AMI, nephrolithiasis, pancreatitis, peritonitis, adrenal insufficiency,lead poisoning, iron toxicity, intestinal ischemia, constipation, UTI,SBO/LBO, splenic rupture, biliary disease, IBD, IBS, PUD, or hepatitis.   The emergent causes of chest pain include: Acute coronary syndrome, tamponade, pericarditis/myocarditis, aortic dissection, pulmonary embolism, tension pneumothorax, pneumonia, and esophageal rupture.     Co morbidities: Discussed in HPI   Brief History:  Patient is a 57 year old male  with a past medical history significant for DM2 and noncompliance with his insulin and also history of polysubstance abuse  He states that he is here in the emergency room today because of chest pain and abdominal pain.  He describes these areas of pain as sharp and nonradiating and began at 5 AM this morning approximately 5 or 6 hours after he snorted cocaine.  He also snorted heroin today.  He denies any syncope or near syncope.  States that he has been  coughing and congested for the past 3 days.  He has been vomiting frequently since his symptoms began.  Nonbloody nonbilious emesis.  Denies any etoh use.     EMR reviewed including pt PMHx, past surgical history and past visits to ER.   See HPI for more details   Lab Tests:   I ordered and independently interpreted labs. Labs notable for Mild anion gap and mild elevation in glucose however this very well may be from dehydration as well as the hyperglycemia.  ABG confirms patient is not acidotic.  He is hyperventilating which is likely the reason for the iatrogenic respiratory alkalosis that is evident.  BMP repeated shows improvement  Imaging Studies:  NAD. I personally reviewed all imaging studies and no acute abnormality found. I agree with radiology interpretation.    Cardiac Monitoring:  The patient was maintained on a cardiac monitor.  I personally viewed and interpreted the cardiac monitored which showed an underlying rhythm of: NSR EKG non-ischemic some new T wave inversions from prior   Medicines ordered:  I ordered medication including benadryl, reglan, versed, insulin, tylenol, LR, zofran for NV and pain Reevaluation of the patient after these medicines showed that the patient improved I have reviewed the patients home medicines and have made adjustments as needed   Critical Interventions:     Consults/Attending Physician   I discussed this case with my attending physician who cosigned this note including  patient's presenting symptoms, physical exam, and planned diagnostics and interventions. Attending physician stated agreement with plan or made changes to plan which were implemented.   Reevaluation:  After the interventions noted above I re-evaluated patient and found that they have :improved   Social Determinants of Health:  The patient's social determinants of health were a factor in the care of this patient    Problem List / ED Course:  Patient is here with abdominal and chest pain.  Very poor historian and not very compliant with my exam.  He is willing to tell me that he did heroin and cocaine recently.  His most notable issue after his workup today which has been overall reassuring is that he continues to vomit.  Will give him some additional fluids, promethazine and some more insulin via sliding scale insulin orders.  Anticipate admission if he does not p.o. challenge and is not improving from mental status to point.  CT head ordered.   Dispostion:  After consideration of the diagnostic results and the patients response to treatment, I feel that the patent would benefit from re-evaluation.    Final Clinical Impression(s) / ED Diagnoses Final diagnoses:  Nausea and vomiting, unspecified vomiting type  Chest pain, unspecified type  Generalized abdominal pain  Polysubstance abuse ([place])    Rx / DC Orders ED Discharge Orders          Ordered    acetaminophen (TYLENOL) 500 MG tablet  Every 6 hours PRN        08/25/22 2129    ondansetron (ZOFRAN-ODT) 4 MG disintegrating tablet  Every 8 hours PRN        08/25/22 2129              Tedd Sias, [place] 08/25/22 2241    Leanord Asal K, DO 08/25/22 2341

## 2022-08-25 NOTE — ED Notes (Signed)
This RN expressed concern administering phenergan due to pt lethargy. Pt responds to physical stimuli and is lethargic due to drug use PTA and multiple medication administration in ER

## 2022-08-25 NOTE — ED Triage Notes (Signed)
BIBA c/o using heroin by snorting at 0100 with nausea body aches, congestion, cough, and sob.  A&O x4 Hx DM [id number]

## 2022-08-25 NOTE — ED Notes (Signed)
Upon going to d/c patient, pt had vomited all over himself. Ova Freshwater, PA aware

## 2022-08-25 NOTE — ED Notes (Signed)
Patient ambulatory to triage bathroom at this time

## 2022-08-25 NOTE — ED Notes (Signed)
Patient transported to CT 

## 2022-08-25 NOTE — ED Notes (Signed)
Pt tolerated PO challenge

## 2022-08-26 ENCOUNTER — Encounter (HOSPITAL_COMMUNITY): Payer: Self-pay | Admitting: Emergency Medicine

## 2022-08-26 ENCOUNTER — Emergency Department (HOSPITAL_COMMUNITY): Payer: Medicaid Other

## 2022-08-26 DIAGNOSIS — Z91148 Patient's other noncompliance with medication regimen for other reason: Secondary | ICD-10-CM | POA: Diagnosis not present

## 2022-08-26 DIAGNOSIS — J690 Pneumonitis due to inhalation of food and vomit: Secondary | ICD-10-CM | POA: Diagnosis present

## 2022-08-26 DIAGNOSIS — E872 Acidosis, unspecified: Secondary | ICD-10-CM | POA: Diagnosis present

## 2022-08-26 DIAGNOSIS — E1165 Type 2 diabetes mellitus with hyperglycemia: Secondary | ICD-10-CM | POA: Diagnosis present

## 2022-08-26 DIAGNOSIS — F1110 Opioid abuse, uncomplicated: Secondary | ICD-10-CM | POA: Diagnosis present

## 2022-08-26 DIAGNOSIS — F1410 Cocaine abuse, uncomplicated: Secondary | ICD-10-CM | POA: Diagnosis present

## 2022-08-26 DIAGNOSIS — J189 Pneumonia, unspecified organism: Secondary | ICD-10-CM | POA: Diagnosis not present

## 2022-08-26 DIAGNOSIS — E873 Alkalosis: Secondary | ICD-10-CM | POA: Diagnosis present

## 2022-08-26 DIAGNOSIS — Z1152 Encounter for screening for COVID-19: Secondary | ICD-10-CM | POA: Diagnosis not present

## 2022-08-26 DIAGNOSIS — R1110 Vomiting, unspecified: Secondary | ICD-10-CM | POA: Diagnosis present

## 2022-08-26 DIAGNOSIS — R000 Tachycardia, unspecified: Secondary | ICD-10-CM

## 2022-08-26 DIAGNOSIS — Z5329 Procedure and treatment not carried out because of patient's decision for other reasons: Secondary | ICD-10-CM | POA: Diagnosis present

## 2022-08-26 DIAGNOSIS — Z91199 Patient's noncompliance with other medical treatment and regimen due to unspecified reason: Secondary | ICD-10-CM | POA: Diagnosis not present

## 2022-08-26 DIAGNOSIS — N179 Acute kidney failure, unspecified: Secondary | ICD-10-CM | POA: Diagnosis present

## 2022-08-26 DIAGNOSIS — Z794 Long term (current) use of insulin: Secondary | ICD-10-CM | POA: Diagnosis not present

## 2022-08-26 DIAGNOSIS — F17290 Nicotine dependence, other tobacco product, uncomplicated: Secondary | ICD-10-CM | POA: Diagnosis present

## 2022-08-26 DIAGNOSIS — R0789 Other chest pain: Secondary | ICD-10-CM | POA: Diagnosis present

## 2022-08-26 DIAGNOSIS — E119 Type 2 diabetes mellitus without complications: Secondary | ICD-10-CM

## 2022-08-26 DIAGNOSIS — T405X1A Poisoning by cocaine, accidental (unintentional), initial encounter: Secondary | ICD-10-CM | POA: Diagnosis present

## 2022-08-26 DIAGNOSIS — Z79899 Other long term (current) drug therapy: Secondary | ICD-10-CM | POA: Diagnosis not present

## 2022-08-26 DIAGNOSIS — R0989 Other specified symptoms and signs involving the circulatory and respiratory systems: Secondary | ICD-10-CM

## 2022-08-26 DIAGNOSIS — R0682 Tachypnea, not elsewhere classified: Secondary | ICD-10-CM

## 2022-08-26 DIAGNOSIS — E1140 Type 2 diabetes mellitus with diabetic neuropathy, unspecified: Secondary | ICD-10-CM | POA: Diagnosis present

## 2022-08-26 LAB — RESP PANEL BY RT-PCR (RSV, FLU A&B, COVID)  RVPGX2
Influenza A by PCR: NEGATIVE
Influenza B by PCR: NEGATIVE
Resp Syncytial Virus by PCR: NEGATIVE
SARS Coronavirus 2 by RT PCR: NEGATIVE

## 2022-08-26 LAB — PROCALCITONIN: Procalcitonin: 0.92 ng/mL

## 2022-08-26 LAB — RAPID URINE DRUG SCREEN, HOSP PERFORMED
Amphetamines: NOT DETECTED
Barbiturates: NOT DETECTED
Benzodiazepines: NOT DETECTED
Cocaine: POSITIVE — AB
Opiates: NOT DETECTED
Tetrahydrocannabinol: NOT DETECTED

## 2022-08-26 LAB — CBG MONITORING, ED
Glucose-Capillary: 391 mg/dL — ABNORMAL HIGH (ref 70–99)
Glucose-Capillary: 281 mg/dL — ABNORMAL HIGH (ref 70–99)

## 2022-08-26 LAB — LACTIC ACID, PLASMA: Lactic Acid, Venous: 1.6 mmol/L (ref 0.5–1.9)

## 2022-08-26 LAB — GLUCOSE, CAPILLARY
Glucose-Capillary: 287 mg/dL — ABNORMAL HIGH (ref 70–99)
Glucose-Capillary: 231 mg/dL — ABNORMAL HIGH (ref 70–99)

## 2022-08-26 LAB — HIV ANTIBODY (ROUTINE TESTING W REFLEX): HIV Screen 4th Generation wRfx: NONREACTIVE

## 2022-08-26 MED ORDER — OXYCODONE HCL 5 MG PO TABS: 5.0000 mg | ORAL_TABLET | ORAL | Status: DC | PRN

## 2022-08-26 MED ORDER — PANTOPRAZOLE SODIUM 40 MG PO TBEC
40.0000 mg | DELAYED_RELEASE_TABLET | Freq: Every day | ORAL | Status: DC
  Administered 2022-08-26 – 2022-08-27 (×2): 40 mg via ORAL
  Filled 2022-08-26 (×2): qty 1

## 2022-08-26 MED ORDER — ENOXAPARIN SODIUM 40 MG/0.4ML IJ SOSY: 40.0000 mg | PREFILLED_SYRINGE | INTRAMUSCULAR | Status: DC

## 2022-08-26 MED ORDER — SODIUM CHLORIDE 0.9 % IV SOLN: INTRAVENOUS | Status: DC

## 2022-08-26 MED ORDER — SODIUM CHLORIDE 0.9 % IV SOLN
500.0000 mg | INTRAVENOUS | Status: DC
  Administered 2022-08-26 – 2022-08-27 (×2): 500 mg via INTRAVENOUS
  Filled 2022-08-26 (×2): qty 5

## 2022-08-26 MED ORDER — INSULIN ASPART 100 UNIT/ML IJ SOLN
0.0000 [IU] | Freq: Three times a day (TID) | INTRAMUSCULAR | Status: DC
  Administered 2022-08-26: 8 [IU] via SUBCUTANEOUS
  Administered 2022-08-26: 15 [IU] via SUBCUTANEOUS
  Administered 2022-08-26: 8 [IU] via SUBCUTANEOUS
  Administered 2022-08-27: 5 [IU] via SUBCUTANEOUS
  Administered 2022-08-27: 8 [IU] via SUBCUTANEOUS
  Administered 2022-08-27: 2 [IU] via SUBCUTANEOUS
  Filled 2022-08-26: qty 0.15

## 2022-08-26 MED ORDER — IPRATROPIUM-ALBUTEROL 0.5-2.5 (3) MG/3ML IN SOLN: 3.0000 mL | Freq: Four times a day (QID) | RESPIRATORY_TRACT | Status: DC | PRN

## 2022-08-26 MED ORDER — ONDANSETRON HCL 4 MG PO TABS: 4.0000 mg | ORAL_TABLET | Freq: Four times a day (QID) | ORAL | Status: DC | PRN

## 2022-08-26 MED ORDER — INSULIN ASPART 100 UNIT/ML IJ SOLN
0.0000 [IU] | Freq: Every day | INTRAMUSCULAR | Status: DC
  Administered 2022-08-26: 3 [IU] via SUBCUTANEOUS
  Filled 2022-08-26: qty 0.05

## 2022-08-26 MED ORDER — SODIUM CHLORIDE 0.9 % IV SOLN
1.0000 g | Freq: Once | INTRAVENOUS | Status: AC
  Administered 2022-08-26: 1 g via INTRAVENOUS
  Filled 2022-08-26: qty 10

## 2022-08-26 MED ORDER — ONDANSETRON HCL 4 MG/2ML IJ SOLN
4.0000 mg | Freq: Four times a day (QID) | INTRAMUSCULAR | Status: DC | PRN
  Administered 2022-08-26: 4 mg via INTRAVENOUS
  Filled 2022-08-26: qty 2

## 2022-08-26 MED ORDER — INSULIN GLARGINE-YFGN 100 UNIT/ML ~~LOC~~ SOLN
10.0000 [IU] | Freq: Every day | SUBCUTANEOUS | Status: DC
  Administered 2022-08-26 – 2022-08-27 (×2): 10 [IU] via SUBCUTANEOUS
  Filled 2022-08-26 (×2): qty 0.1

## 2022-08-26 MED ORDER — SODIUM CHLORIDE 0.9 % IV BOLUS
1000.0000 mL | Freq: Once | INTRAVENOUS | Status: AC
  Administered 2022-08-26: 1000 mL via INTRAVENOUS

## 2022-08-26 MED ORDER — ALBUTEROL SULFATE (2.5 MG/3ML) 0.083% IN NEBU: 2.5000 mg | INHALATION_SOLUTION | RESPIRATORY_TRACT | Status: DC | PRN

## 2022-08-26 MED ORDER — ACETAMINOPHEN 650 MG RE SUPP: 650.0000 mg | Freq: Four times a day (QID) | RECTAL | Status: DC | PRN

## 2022-08-26 MED ORDER — ACETAMINOPHEN 325 MG PO TABS: 650.0000 mg | ORAL_TABLET | Freq: Four times a day (QID) | ORAL | Status: DC | PRN

## 2022-08-26 MED ORDER — TRAZODONE HCL 50 MG PO TABS
25.0000 mg | ORAL_TABLET | Freq: Every evening | ORAL | Status: DC | PRN
  Administered 2022-08-26 – 2022-08-27 (×2): 25 mg via ORAL
  Filled 2022-08-26 (×2): qty 1

## 2022-08-26 MED ORDER — SODIUM CHLORIDE 0.9 % IV SOLN
1.0000 g | INTRAVENOUS | Status: DC
  Administered 2022-08-27: 1 g via INTRAVENOUS
  Filled 2022-08-26 (×2): qty 10

## 2022-08-26 NOTE — ED Notes (Signed)
Liquid noted all over the floor under the stretcher. Towels put down to absorb liquid. EVS contacted to mop.

## 2022-08-26 NOTE — ED Notes (Signed)
Pt provided with beverage per request.

## 2022-08-26 NOTE — Plan of Care (Signed)

## 2022-08-26 NOTE — ED Notes (Signed)
Pt refusing to stay on stretcher. Repeatedly pulls off all monitoring devices and was observed by this RN in the bathroom drinking from the faucet after being told repeatedly to not drink anything. Pt had also been provided with glycerin swabs prior to this. Pt uncooperative and rude to staff.

## 2022-08-26 NOTE — ED Provider Notes (Signed)
Received at shift change from [facility]   In short patient with medical history including diabetes, polysubstance dependency presenting with complaints of a cough, states been on for last couple days, states is slightly productive but has no other complaints, he is not endorsing any illicit drug use, denies any alcohol use, endorse any suicidal homicidal ideations.  States that he has been nauseous because he has no food on his stomach.  Per previous provider admits to polysubstance use, allow patient to metabolize to baseline, p.o. challenge.  Physical Exam  BP (!) 146/81   Pulse (!) 108   Temp 97.7 F (36.5 C) (Oral)   Resp (!) 30   Wt 95 kg   SpO2 98%   BMI 31.84 kg/m   Physical Exam Vitals and nursing note reviewed.  Constitutional:      General: He is not in acute distress.    Appearance: He is not ill-appearing.  HENT:     Head: Normocephalic and atraumatic.     Nose: No congestion.  Eyes:     Conjunctiva/sclera: Conjunctivae normal.  Cardiovascular:     Rate and Rhythm: Normal rate and regular rhythm.     Pulses: Normal pulses.     Heart sounds: No murmur heard.    No friction rub. No gallop.  Pulmonary:     Effort: No respiratory distress.     Breath sounds: No wheezing, rhonchi or rales.     Comments: Slightly tachypneic on my exam but no acute respiratory distress, patient has noted rhonchi throughout, no wheezing or rails noted. Abdominal:     Palpations: Abdomen is soft.     Tenderness: There is no abdominal tenderness. There is no right CVA tenderness or left CVA tenderness.  Skin:    General: Skin is warm and dry.  Neurological:     Mental Status: He is alert.     Comments: No facial asymmetry no difficulty with word finding following two-step commands there is no unilateral weakness present.  Psychiatric:        Mood and Affect: Mood normal.     Procedures  Procedures  ED Course / MDM   Clinical Course as of 08/26/22 0522  Wed Aug 25, 2022   1221 CP and abd pain that is constant and sharp non-radiating since 5am this morning approx 5-6 hours after he insufflated cocaine. Also did heroin today.   3 days of cough congestion as well.  [WF]  2025 IMPRESSION: 1. Motion degraded study. 2. No evidence for thoracoabdominal aortic aneurysm. No evidence for thoracoabdominal aortic dissection. 3. No acute findings in the chest, abdomen, or pelvis. 4.  Emphysema (ICD10-J43.9).   Electronically Signed   By: Misty Stanley M.D.   On: 08/25/2022 20:19   [WF]    Clinical Course User Index [WF] Tedd Sias, PA   Medical Decision Making Amount and/or Complexity of Data Reviewed Labs: ordered. Radiology: ordered.  Risk OTC drugs. Prescription drug management. Decision regarding hospitalization.    Lab Tests:  I Ordered, and personally interpreted labs.  The pertinent results include: CBC is unremarkable, CMP reveals CO2 of 21, glucose 262, anion gap 17, UA shows 80 ketones, negative delta troponin, magnesium 2.1, repeat BMP reveals sodium 134, glucose 253, anion gap 15, ethanol less than 10   Imaging Studies ordered:  I ordered imaging studies including chest x-ray, CT angio dissection, CT head I independently visualized and interpreted imaging which showed chest x-ray negative acute findings, CT head negative acute findings,  dissection study negative acute findings I agree with the radiologist interpretation   Cardiac Monitoring:  The patient was maintained on a cardiac monitor.  I personally viewed and interpreted the cardiac monitored which showed an underlying rhythm of: EKG sinus without signs of ischemia   Medicines ordered and prescription drug management:  I ordered medication including fluid's I have reviewed the patients home medicines and have made adjustments as needed  Critical Interventions:  N/A   Reevaluation:  Assessed the patient, appears to be resting comfortably, will continue to  monitor, encourage p.o. challenge.  CT head was unremarkable, patient has become more tachycardic, patient have a productive cough, will reassess with chest x-ray  Chest x-ray reveals possible pneumonia left lower lobe, failed p.o. challenge, will start on antibiotics, and admit to medicine.  Patient is agreement this plan    Consultations Obtained:  I requested consultation with the hospitalist Dr. Bridgett Larsson,  and discussed lab and imaging findings as well as pertinent plan - they recommend: Will admit but will be a carryover to the oncoming team.    Test Considered:  N/A    Rule out Suspicion for intracranial bleed or mass is low at this time as CT imaging negative.  I doubt CVA as he is no focal deficit present my exam.  Suspicion for aspirin pneumonia is low at this time as x-ray reveals right lower lobe pneumonia typically be in the upper lobes, suspect this is community-acquired pneumonia will start antibiotics.    Dispostion and problem list  After consideration of the diagnostic results and the patients response to treatment, I feel that the patent would benefit from admission.  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  Possible need GI evaluation. Left lower lobe opacity-suspect pneumonia, has a productive cough, rales on my exam, started on antibiotics, will need continued monitoring.           Marcello Fennel, PA-C 08/26/22 0522    Shanon Rosser, MD 08/26/22 [phone]

## 2022-08-26 NOTE — ED Notes (Signed)
CBG: 391

## 2022-08-26 NOTE — ED Notes (Signed)
Lab called to add a UDS.

## 2022-08-26 NOTE — ED Notes (Signed)
Pt out of bed again, all monitoring devices have been removed by him once again. Pt uncooperative with care.

## 2022-08-26 NOTE — ED Notes (Signed)
RN responded to Pt being off VS monitor. Upon arrival to room, pt was standing and urinating in bedside sink. Pt was assisted back into be and was reconnected to monitor. Requested pt to use call bell. Pt verbalized understanding. Pt repositioned and call bell within reach.

## 2022-08-26 NOTE — ED Notes (Signed)
Pt ok to have ice chips per provider. Attempted to give pt cup of ice, pt is asleep at this time. Cup placed at bedside within reach.

## 2022-08-26 NOTE — ED Notes (Signed)
Sandwich and gingerale provided to pt.

## 2022-08-26 NOTE — H&P (Signed)
History and Physical  Mike Small [id number] DOB: 1966/05/22 DOA: 08/25/2022   PCP: Javier Docker, MD   Patient coming from: Home   Chief Complaint: Cough   HPI: Mike Small is a 57 y.o. male with medical history significant for insulin-dependent type 2 diabetes, heroin abuse who presents to the hospital complaining of a couple of days of abuse who presents to the hospital with a couple of days of productive cough.  Denies recent illicit drug use.  States that he has been nauseous and vomiting.  He is on room air, but tachycardic and tachypneic.  ED Course: Upon initial presentation the emergency department, he was afebrile, heart rate 117, blood pressure 187/113.  This is now improved.  He has remained on room air saturating 98 to 100%.  Lab work reveals some glucose 222 initially, now 391.  CBC unremarkable, BMP otherwise unremarkable as well including LFTs.  Attempts are made to discharge the patient home as he is on room air, even though chest x-ray shows possible mild early pneumonia.  However, the patient continued to complain of nausea and vomiting.  He was ordered n.p.o., but despite multiple reminders from nursing staff, he was found drinking water out of the sink in his room.  My discussion, he denies any current nausea, and wants to eat.  Review of Systems: Please see HPI for pertinent positives and negatives. A complete 10 system review of systems are otherwise negative.  Past Medical History:  Diagnosis Date   DM (diabetes mellitus) ([place])    Past Surgical History:  Procedure Laterality Date   ANKLE SURGERY     KNEE SURGERY     SHOULDER SURGERY      Social History:  reports that he has quit smoking. He has never used smokeless tobacco. He reports that he does not drink alcohol and does not use drugs.   No Known Allergies  No family history on file.   Prior to Admission medications   Medication Sig Start Date End Date Taking? Authorizing Provider   acetaminophen (TYLENOL) 500 MG tablet Take 2 tablets (1,000 mg total) by mouth every 6 (six) hours as needed. 08/25/22  Yes Fondaw, Wylder S, PA  ondansetron (ZOFRAN-ODT) 4 MG disintegrating tablet Take 1 tablet (4 mg total) by mouth every 8 (eight) hours as needed for nausea or vomiting. 08/25/22  Yes Fondaw, Kathleene Hazel, PA  calcium carbonate (TUMS) 500 MG chewable tablet Chew 2 tablets (400 mg of elemental calcium total) by mouth 2 (two) times daily as needed for indigestion or heartburn. 10/13/17   Emeline General, PA-C  dicyclomine (BENTYL) 20 MG tablet Take 1 tablet (20 mg total) by mouth every 12 (twelve) hours as needed (abdominal pain/cramping). 10/11/17   Antonietta Breach, PA-C  gabapentin (NEURONTIN) 100 MG capsule Take 1 capsule (100 mg total) by mouth 2 (two) times daily. 11/16/13   Clayton Bibles, PA-C  HYDROcodone-acetaminophen (NORCO/VICODIN) 5-325 MG tablet Take 1 tablet by mouth every 4 (four) hours as needed. 03/27/20   Henderly, Britni A, PA-C  ibuprofen (ADVIL) 600 MG tablet Take 1 tablet (600 mg total) by mouth every 6 (six) hours as needed. 03/06/22   Leanord Asal K, DO  ibuprofen (ADVIL,MOTRIN) 600 MG tablet Take 1 tablet (600 mg total) by mouth every 6 (six) hours as needed. Patient not taking: Reported on 10/13/2017 10/25/16   Varney Biles, MD  LANTUS SOLOSTAR 100 UNIT/ML Solostar Pen Inject 20 Units into the skin at bedtime. 08/15/17   [provider]  loperamide (IMODIUM) 2 MG capsule Take 1 capsule (2 mg total) by mouth 4 (four) times daily as needed for diarrhea or loose stools. 03/06/22   Leanord Asal K, DO  NOVOLIN N 100 UNIT/ML injection Inject 10 Units into the skin 2 (two) times daily. 08/24/17   [provider]  omeprazole (PRILOSEC) 20 MG capsule Take 1 capsule (20 mg total) by mouth daily. 10/13/17   Avie Echevaria B, PA-C  ondansetron (ZOFRAN) 4 MG tablet Take 1 tablet (4 mg total) by mouth every 6 (six) hours. 03/06/22   Kemper Durie, DO   promethazine (PHENERGAN) 25 MG tablet Take 1 tablet (25 mg total) by mouth every 6 (six) hours as needed for nausea or vomiting. 10/11/17   Antonietta Breach, PA-C  tetrahydrozoline-zinc (VISINE-AC) 0.05-0.25 % ophthalmic solution Place 1 drop into both eyes as needed (dry eyes).    [provider]    Physical Exam: BP (!) 143/97   Pulse (!) 51   Temp 97.7 F (36.5 C) (Oral)   Resp (!) 52   Wt 95 kg   SpO2 97%   BMI 31.84 kg/m   General:  Alert, up in a chair at the bedside.  He seems slightly agitated, but not in acute distress. Eyes: EOMI, clear conjuctivae, white sclerea Neck: supple, no masses, trachea mildline  Cardiovascular: Regular and tachycardic, no murmurs or rubs, no peripheral edema  Respiratory: Diffuse rhonchi with some tachypnea, no retractions, stridor or respiratory distress.  No cough. Abdomen: soft, nontender, nondistended, normal bowel tones heard  Skin: dry, no rashes  Musculoskeletal: no joint effusions, normal range of motion  Psychiatric: appropriate affect, normal speech  Neurologic: extraocular muscles intact, clear speech, moving all extremities with intact sensorium          Labs on Admission:  Basic Metabolic Panel: Recent Labs  Lab 08/25/22 1221 08/25/22 2152  NA 135 134*  K 3.6 3.5  CL 97* 97*  CO2 21* 22  GLUCOSE 262* 253*  BUN 9 11  CREATININE 0.82 0.71  CALCIUM 9.1 9.2  MG  --  2.1   Liver Function Tests: Recent Labs  Lab 08/25/22 1221  AST 24  ALT 26  ALKPHOS 77  BILITOT 1.5*  PROT 8.1  ALBUMIN 4.2   Recent Labs  Lab 08/25/22 1221  LIPASE 42   No results for input(s): "AMMONIA" in the last 168 hours. CBC: Recent Labs  Lab 08/25/22 1221  WBC 7.7  HGB 15.6  HCT 45.4  MCV 92.7  PLT 248   Cardiac Enzymes: No results for input(s): "CKTOTAL", "CKMB", "CKMBINDEX", "TROPONINI" in the last 168 hours.  BNP (last 3 results) No results for input(s): "BNP" in the last 8760 hours.  ProBNP (last 3 results) No  results for input(s): "PROBNP" in the last 8760 hours.  CBG: Recent Labs  Lab 08/25/22 2103 08/25/22 2253  GLUCAP 222* 249*    Radiological Exams on Admission: DG Chest 2 View  Result Date: 08/26/2022 CLINICAL DATA:  Worsening cough with congestion. Question aspiration. EXAM: CHEST - 2 VIEW COMPARISON:  Yesterday FINDINGS: Increased markings at the left base ; area was clear yesterday. No edema, effusion, or pneumothorax. Normal heart size. IMPRESSION: Equivocal for early infiltrate at the left base, not seen yesterday. Consider follow-up. Electronically Signed   By: Jorje Guild M.D.   On: 08/26/2022 04:11   CT HEAD WO CONTRAST (5MM)  Result Date: 08/25/2022 CLINICAL DATA:  Delirium. EXAM: CT HEAD WITHOUT CONTRAST TECHNIQUE: Contiguous  axial images were obtained from the base of the skull through the vertex without intravenous contrast. RADIATION DOSE REDUCTION: This exam was performed according to the departmental dose-optimization program which includes automated exposure control, adjustment of the mA and/or kV according to patient size and/or use of iterative reconstruction technique. COMPARISON:  None Available. FINDINGS: Brain: No evidence of acute infarction, hemorrhage, hydrocephalus, extra-axial collection or mass lesion/mass effect. Vascular: No hyperdense vessel or unexpected calcification. Skull: Normal. Negative for fracture or focal lesion. Sinuses/Orbits: No acute finding. Other: None. IMPRESSION: No acute intracranial abnormality. Electronically Signed   By: Ronney Asters M.D.   On: 08/25/2022 23:33   CT Angio Chest/Abd/Pel for Dissection W and/or W/WO  Result Date: 08/25/2022 CLINICAL DATA:  Acute aortic syndrome. EXAM: CT ANGIOGRAPHY CHEST, ABDOMEN AND PELVIS TECHNIQUE: Non-contrast CT of the chest was initially obtained. Multidetector CT imaging through the chest, abdomen and pelvis was performed using the standard protocol during bolus administration of intravenous  contrast. Multiplanar reconstructed images and MIPs were obtained and reviewed to evaluate the vascular anatomy. RADIATION DOSE REDUCTION: This exam was performed according to the departmental dose-optimization program which includes automated exposure control, adjustment of the mA and/or kV according to patient size and/or use of iterative reconstruction technique. CONTRAST:  166m OMNIPAQUE IOHEXOL 350 MG/ML SOLN COMPARISON:  Abdomen and pelvis CT 10/11/2017 FINDINGS: CTA CHEST FINDINGS Cardiovascular: The heart size is normal. No substantial pericardial effusion. Motion degraded study. No evidence for thoracic aortic aneurysm. No dissection flap discernible within the thoracic aortic given the motion degradation. Mediastinum/Nodes: No mediastinal lymphadenopathy. There is no hilar lymphadenopathy. The esophagus has normal imaging features. There is no axillary lymphadenopathy. Lungs/Pleura: Centrilobular and paraseptal emphysema evident. Fine architectural detail of lung parenchyma is obscured by breathing motion. Within this limitation no overtly suspicious nodule or mass is evident although tiny pulmonary nodules could be obscured. No evidence for pneumothorax. No focal airspace consolidation. There is no evidence of pleural effusion. Musculoskeletal: No worrisome lytic or sclerotic osseous abnormality. Substantial motion artifact noted through the sternum and thoracic spine limiting assessment. Review of the MIP images confirms the above findings. CTA ABDOMEN AND PELVIS FINDINGS VASCULAR Aorta: Normal caliber aorta without aneurysm, dissection, vasculitis or significant stenosis. Celiac: Patent without evidence of aneurysm, dissection, vasculitis or significant stenosis. SMA: Patent without evidence of aneurysm, dissection, vasculitis or significant stenosis. Renals: Both renal arteries are patent without evidence of aneurysm, dissection, vasculitis, fibromuscular dysplasia or significant stenosis. Accessory  renal arteries could be obscured by motion artifact. IMA: Patent without evidence of aneurysm, dissection, vasculitis or significant stenosis. Inflow: Patent without evidence of aneurysm, dissection, vasculitis or significant stenosis. Veins: No obvious venous abnormality within the limitations of this arterial phase study. Review of the MIP images confirms the above findings. NON-VASCULAR Hepatobiliary: No suspicious focal abnormality within the liver parenchyma. There is no evidence for gallstones, gallbladder wall thickening, or pericholecystic fluid. No intrahepatic or extrahepatic biliary dilation. Pancreas: No focal mass lesion. No dilatation of the main duct. No intraparenchymal cyst. No peripancreatic edema. Spleen: No splenomegaly. No focal mass lesion. Adrenals/Urinary Tract: No adrenal nodule or mass. Kidneys unremarkable. No evidence for hydroureter. The urinary bladder appears normal for the degree of distention. Stomach/Bowel: Stomach is distended with fluid and gas. Duodenum is normally positioned as is the ligament of Treitz. No small bowel wall thickening. No small bowel dilatation. Fine detail of small bowel and colon is obscured by motion artifact. No colonic dilatation. Lymphatic: No evidence for abdominal or pelvic lymphadenopathy Reproductive:  The prostate gland and seminal vesicles are unremarkable. Other: No intraperitoneal free fluid. Musculoskeletal: No acute or significant osseous findings. Review of the MIP images confirms the above findings. IMPRESSION: 1. Motion degraded study. 2. No evidence for thoracoabdominal aortic aneurysm. No evidence for thoracoabdominal aortic dissection. 3. No acute findings in the chest, abdomen, or pelvis. 4.  Emphysema (ICD10-J43.9). Electronically Signed   By: Misty Stanley M.D.   On: 08/25/2022 20:19   DG Chest Portable 1 View  Result Date: 08/25/2022 CLINICAL DATA:  Chest pain EXAM: PORTABLE CHEST 1 VIEW COMPARISON:  Chest x-ray 03/06/2022 FINDINGS:  The aorta appears ectatic. Heart size is within normal limits. The lungs are clear. There is no pleural effusion or pneumothorax. No acute fractures are seen. IMPRESSION: 1. No active disease. 2. Ectatic aorta. Electronically Signed   By: Ronney Asters M.D.   On: 08/25/2022 15:11    Assessment/Plan Principal Problem:   PNA (pneumonia) -suspect this patient may have pneumonia, which could be causing his vomiting, he has chest x-ray abnormalities, with tachypnea and tachycardia. -Observation admission -Supplemental oxygen as needed, though he is now stable on room air -Will start IV fluids, continue empiric IV azithromycin and Rocephin -Check lactate and procalcitonin -Will check respiratory virus swab Active Problems:   Heroin abuse ([facility])   Vomiting -improving with supportive care   DM (diabetes mellitus) ([place]) -diabetic diet when eating, with sliding scale insulin.  Unclear to me how much insulin he takes, have started his Lantus at half the dose  DVT prophylaxis: Lovenox  Code Status: Full code Consults called: None  Admission status: Observation  Time spent: 24 minutes  Ringo Sherod Neva Seat MD [facility] Pager [phone]  If 7PM-7AM, please contact night-coverage [email or web address] Password [facility]  08/26/2022, 7:51 AM

## 2022-08-26 NOTE — ED Notes (Signed)
PA [place] approved PO fluids. Pt is alert, oriented x4, HOB elevated, and provided with PO fluids. Call bell within reach

## 2022-08-26 NOTE — ED Notes (Signed)
Pt keeps taking off Cardiac Monitor has informed him to please keep on so we can keep monitoring his vitals.

## 2022-08-26 NOTE — ED Notes (Signed)
Admitting MD at bedside. Pt noted to have gotten out of  bed and was sitting in the chair in his room asking for ice chips. Pt in no distress and w/o complaint. Pt noted to be slightly tachypneic.

## 2022-08-26 NOTE — Inpatient Diabetes Management (Signed)
Inpatient Diabetes Program Recommendations  AACE/ADA: New Consensus Statement on Inpatient Glycemic Control (2015)  Target Ranges:  Prepandial:   less than 140 mg/dL      Peak postprandial:   less than 180 mg/dL (1-2 hours)      Critically ill patients:  140 - 180 mg/dL   Lab Results  Component Value Date   GLUCAP 287 (H) 08/26/2022    Review of Glycemic Control  Diabetes history: DM2 Outpatient Diabetes medications: Novolin N 10 units BID Current orders for Inpatient glycemic control: Semglee 10 QD, Novolog 0-15 units TID with meals and 0-5 HD  HgbA1C pending CBGs today: 391, 281, 287 mg/dL today  Inpatient Diabetes Program Recommendations:    Increase Semglee to 10 units BID  Add Novolog 4 units TID with meals if eating > 50%  Continue to follow. Await HgbA1C results.  Thank you. Lorenda Peck, RD, LDN, [place] Inpatient Diabetes Coordinator [phone]

## 2022-08-27 LAB — CBC
HCT: 52.1 % — ABNORMAL HIGH (ref 39.0–52.0)
Hemoglobin: 18.3 g/dL — ABNORMAL HIGH (ref 13.0–17.0)
MCH: 31.7 pg (ref 26.0–34.0)
MCHC: 35.1 g/dL (ref 30.0–36.0)
MCV: 90.1 fL (ref 80.0–100.0)
Platelets: 203 10*3/uL (ref 150–400)
RBC: 5.78 MIL/uL (ref 4.22–5.81)
RDW: 13.4 % (ref 11.5–15.5)
WBC: 16.1 10*3/uL — ABNORMAL HIGH (ref 4.0–10.5)
nRBC: 0 % (ref 0.0–0.2)

## 2022-08-27 LAB — BASIC METABOLIC PANEL
Anion gap: >20 — ABNORMAL HIGH (ref 5–15)
BUN: 33 mg/dL — ABNORMAL HIGH (ref 6–20)
CO2: 18 mmol/L — ABNORMAL LOW (ref 22–32)
Calcium: 8.5 mg/dL — ABNORMAL LOW (ref 8.9–10.3)
Chloride: 98 mmol/L (ref 98–111)
Creatinine, Ser: 1.15 mg/dL (ref 0.61–1.24)
GFR, Estimated: >60 mL/min (ref >60)
Glucose, Bld: 244 mg/dL — ABNORMAL HIGH (ref 70–99)
Potassium: 3.6 mmol/L (ref 3.5–5.1)
Sodium: 137 mmol/L (ref 135–145)

## 2022-08-27 LAB — GLUCOSE, CAPILLARY
Glucose-Capillary: 289 mg/dL — ABNORMAL HIGH (ref 70–99)
Glucose-Capillary: 206 mg/dL — ABNORMAL HIGH (ref 70–99)
Glucose-Capillary: 148 mg/dL — ABNORMAL HIGH (ref 70–99)
Glucose-Capillary: 186 mg/dL — ABNORMAL HIGH (ref 70–99)

## 2022-08-27 MED ORDER — DICYCLOMINE HCL 20 MG PO TABS
20.0000 mg | ORAL_TABLET | Freq: Four times a day (QID) | ORAL | Status: DC | PRN
  Administered 2022-08-27: 20 mg via ORAL
  Filled 2022-08-27 (×2): qty 1

## 2022-08-27 MED ORDER — NAPHAZOLINE-GLYCERIN 0.012-0.25 % OP SOLN: 1.0000 [drp] | Freq: Four times a day (QID) | OPHTHALMIC | Status: DC | PRN

## 2022-08-27 MED ORDER — METFORMIN HCL 500 MG PO TABS: 500.0000 mg | ORAL_TABLET | Freq: Every day | ORAL | Status: DC

## 2022-08-27 MED ORDER — NAPROXEN 250 MG PO TABS: 500.0000 mg | ORAL_TABLET | Freq: Two times a day (BID) | ORAL | Status: DC | PRN

## 2022-08-27 MED ORDER — GLIPIZIDE 5 MG PO TABS
5.0000 mg | ORAL_TABLET | Freq: Every day | ORAL | Status: DC
  Administered 2022-08-27: 5 mg via ORAL
  Filled 2022-08-27: qty 1

## 2022-08-27 MED ORDER — METHOCARBAMOL 500 MG PO TABS
500.0000 mg | ORAL_TABLET | Freq: Three times a day (TID) | ORAL | Status: DC | PRN
  Administered 2022-08-27: 500 mg via ORAL
  Filled 2022-08-27: qty 1

## 2022-08-27 MED ORDER — GABAPENTIN 100 MG PO CAPS
100.0000 mg | ORAL_CAPSULE | Freq: Two times a day (BID) | ORAL | Status: DC
  Administered 2022-08-27 (×2): 100 mg via ORAL
  Filled 2022-08-27 (×2): qty 1

## 2022-08-27 MED ORDER — ONDANSETRON 4 MG PO TBDP: 4.0000 mg | ORAL_TABLET | Freq: Four times a day (QID) | ORAL | Status: DC | PRN

## 2022-08-27 MED ORDER — LOPERAMIDE HCL 2 MG PO CAPS: 2.0000 mg | ORAL_CAPSULE | ORAL | Status: DC | PRN

## 2022-08-27 MED ORDER — SODIUM CHLORIDE 0.9 % IV SOLN: INTRAVENOUS | Status: DC

## 2022-08-27 MED ORDER — HYDROXYZINE HCL 25 MG PO TABS
25.0000 mg | ORAL_TABLET | Freq: Four times a day (QID) | ORAL | Status: DC | PRN
  Administered 2022-08-27: 25 mg via ORAL
  Filled 2022-08-27: qty 1

## 2022-08-27 MED ORDER — INSULIN NPH (HUMAN) (ISOPHANE) 100 UNIT/ML ~~LOC~~ SUSP
10.0000 [IU] | Freq: Two times a day (BID) | SUBCUTANEOUS | Status: DC
  Administered 2022-08-27: 10 [IU] via SUBCUTANEOUS
  Filled 2022-08-27: qty 10

## 2022-08-27 MED ORDER — DICYCLOMINE HCL 20 MG PO TABS: 20.0000 mg | ORAL_TABLET | Freq: Two times a day (BID) | ORAL | Status: DC | PRN

## 2022-08-27 NOTE — Progress Notes (Signed)
    Against Medical Advice  Patient at this time expresses desire to leave the Hospital immediately. Patient has been warned that this is not medically advisable at this time, and can result in medical complications like Death and Disability. Patient understands and accepts the risks involved and assumes full responsibilty of this decision.  This patient has also been advised that if they feel the need for further medical assistance to return to any available ER or dial [phone].  Informed by Nursing staff that this patient has left care and has signed the form  Against Medical Advice on 08/27/2022 at 2320 .    Raenette Rover, DNP, [place]

## 2022-08-27 NOTE — Progress Notes (Signed)
Patient expressed desire to leave unit for "5 minutes of fresh air". Explained to patient the medical need to stay and receive care, patient continued to say he has "to go". AC notified and spoke with patient 10-15 minutes. Patient's wife at bedside and sister, Francesca Jewett, notified and wanted patient to stay but patient was adamant about leaving.   Provider on call made aware.   Security notified and escorted patient out.   Patient signed AMA papers.

## 2022-08-27 NOTE — Plan of Care (Signed)
  Problem: Health Behavior/Discharge Planning: Goal: Ability to manage health-related needs will improve Outcome: Progressing   Problem: Activity: Goal: Risk for activity intolerance will decrease Outcome: Progressing   Problem: Coping: Goal: Level of anxiety will decrease Outcome: Progressing   
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
## 2022-08-27 NOTE — TOC Initial Note (Signed)
Transition of Care [facility]) - Initial/Assessment Note    Patient Details  Name: Mike Small MRN: [id number] Date of Birth: March 29, 1966  Transition of Care [place] [facility]) CM/SW Contact:    Henrietta Dine, RN Phone Number: 08/27/2022, 3:40 PM  Clinical Narrative:                 SDOH risk; spoke w/ pt in room; pt says he is from home and plans to return at d/c; he identified POC Dianna Eisel (ex-wife) [phone]; pt denies IPV, food insecurity, and difficulty paying utilities; he says he has transportation; pt says he has glasses, dentures (upper and lower) and bars in his shower; he says he does not have HA, DME, and home oxygen; TOC will follow.  Expected Discharge Plan: Home/Self Care Barriers to Discharge: Continued Medical Work up   Patient Goals and CMS Choice Patient states their goals for this hospitalization and ongoing recovery are:: home          Expected Discharge Plan and Services   Discharge Planning Services: CM Consult   Living arrangements for the past 2 months: Single Family Home                                      Prior Living Arrangements/Services Living arrangements for the past 2 months: Single Family Home Lives with:: Self Patient language and need for interpreter reviewed:: Yes Do you feel safe going back to the place where you live?: Yes      Need for Family Participation in Patient Care: Yes (Comment) Care giver support system in place?: Yes (comment) Current home services:  (n/a) Criminal Activity/Legal Involvement Pertinent to Current Situation/Hospitalization: No - Comment as needed  Activities of Daily Living Home Assistive Devices/Equipment: None ADL Screening (condition at time of admission) Patient's cognitive ability adequate to safely complete daily activities?: Yes Is the patient deaf or have difficulty hearing?: No Does the patient have difficulty seeing, even when wearing glasses/contacts?: No Does the patient have  difficulty concentrating, remembering, or making decisions?: No Patient able to express need for assistance with ADLs?: Yes Does the patient have difficulty dressing or bathing?: No Independently performs ADLs?: Yes (appropriate for developmental age) Does the patient have difficulty walking or climbing stairs?: No Weakness of Legs: None Weakness of Arms/Hands: None  Permission Sought/Granted Permission sought to share information with : Case Manager Permission granted to share information with : Yes, Verbal Permission Granted  Share Information with NAME: Lenor Coffin, RN, CM     Permission granted to share info w Relationship: Stormy Webb (ex-wife) [phone]     Emotional Assessment Appearance:: Appears stated age Attitude/Demeanor/Rapport: Gracious Affect (typically observed): Accepting Orientation: : Oriented to Self, Oriented to Place, Oriented to  Time, Oriented to Situation Alcohol / Substance Use: Illicit Drugs Psych Involvement: No (comment)  Admission diagnosis:  Generalized abdominal pain [R10.84] CAP (community acquired pneumonia) [J18.9] Polysubstance abuse ([place]) [F19.10] PNA (pneumonia) [J18.9] Chest pain, unspecified type [R07.9] Community acquired pneumonia of right lower lobe of lung [J18.9] Nausea and vomiting, unspecified vomiting type [R11.2] Patient Active Problem List   Diagnosis Date Noted   Heroin abuse ([place]) 08/26/2022   PNA (pneumonia) 08/26/2022   Vomiting 08/26/2022   DM (diabetes mellitus) ([place]) 08/26/2022   Tachycardia 08/26/2022   Tachypnea 08/26/2022   Rales 08/26/2022   CAP (community acquired pneumonia) 08/26/2022   PCP:  Javier Docker, MD Pharmacy:  [facility] [place], [address] [place] [address] Phone: [phone] Fax: [phone]  [id number] - [place], [address]. [address] [place] [address] Phone: [phone]  Fax: [phone]     Social Determinants of Health (SDOH) Social History: SDOH Screenings   Food Insecurity: No Food Insecurity (08/27/2022)  Recent Concern: Food Insecurity - Food Insecurity Present (08/26/2022)  Housing: Low Risk  (08/27/2022)  Transportation Needs: No Transportation Needs (08/27/2022)  Utilities: Not At Risk (08/27/2022)  Tobacco Use: Medium Risk (08/26/2022)   SDOH Interventions: Food Insecurity Interventions: Inpatient TOC Housing Interventions: Inpatient TOC Transportation Interventions: Inpatient TOC Utilities Interventions: Inpatient TOC   Readmission Risk Interventions     No data to display

## 2022-08-27 NOTE — Progress Notes (Signed)
Pt states he is "really sick" due to withdrawal from coccaine and heroine. He is requesting medication for this. Visitor and pt speaking about "pills" and tech heard "I am going to put it in my pocket." No serious symptoms visualized. Vitals WDL.
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
## 2022-08-30 LAB — HEMOGLOBIN A1C
Hgb A1c MFr Bld: >15.5 % — ABNORMAL HIGH (ref 4.8–5.6)
Mean Plasma Glucose: >398 mg/dL

## 2022-09-01 LAB — OPIATE, QUANTITATIVE, URINE
OXYCODONE+OXYMORPHONE UR QL SCN: NEGATIVE
Opiates: NEGATIVE

## 2023-04-04 ENCOUNTER — Emergency Department (HOSPITAL_COMMUNITY)
Admission: EM | Admit: 2023-04-04 | Discharge: 2023-04-04 | Disposition: A | Discharge status: Home / Self Care | Payer: MEDICAID | Attending: Emergency Medicine | Admitting: Emergency Medicine

## 2023-04-04 DIAGNOSIS — R7309 Other abnormal glucose: Secondary | ICD-10-CM | POA: Diagnosis not present

## 2023-04-04 DIAGNOSIS — R110 Nausea: Secondary | ICD-10-CM | POA: Diagnosis not present

## 2023-04-04 DIAGNOSIS — T40602A Poisoning by unspecified narcotics, intentional self-harm, initial encounter: Secondary | ICD-10-CM

## 2023-04-04 DIAGNOSIS — T402X1A Poisoning by other opioids, accidental (unintentional), initial encounter: Secondary | ICD-10-CM | POA: Insufficient documentation

## 2023-04-04 LAB — BASIC METABOLIC PANEL
Anion gap: 9 (ref 5–15)
BUN: 9 mg/dL (ref 6–20)
CO2: 25 mmol/L (ref 22–32)
Calcium: 8.9 mg/dL (ref 8.9–10.3)
Chloride: 104 mmol/L (ref 98–111)
Creatinine, Ser: 0.87 mg/dL (ref 0.61–1.24)
GFR, Estimated: >60 mL/min (ref >60)
Glucose, Bld: 204 mg/dL — ABNORMAL HIGH (ref 70–99)
Potassium: 3.3 mmol/L — ABNORMAL LOW (ref 3.5–5.1)
Sodium: 138 mmol/L (ref 135–145)

## 2023-04-04 LAB — CBC WITH DIFFERENTIAL/PLATELET
Abs Immature Granulocytes: 0.04 10*3/uL (ref 0.00–0.07)
Basophils Absolute: 0 10*3/uL (ref 0.0–0.1)
Basophils Relative: 0 %
Eosinophils Absolute: 0.1 10*3/uL (ref 0.0–0.5)
Eosinophils Relative: 1 %
HCT: 40.4 % (ref 39.0–52.0)
Hemoglobin: 14.1 g/dL (ref 13.0–17.0)
Immature Granulocytes: 0 %
Lymphocytes Relative: 14 %
Lymphs Abs: 1.7 10*3/uL (ref 0.7–4.0)
MCH: 33.1 pg (ref 26.0–34.0)
MCHC: 34.9 g/dL (ref 30.0–36.0)
MCV: 94.8 fL (ref 80.0–100.0)
Monocytes Absolute: 0.5 10*3/uL (ref 0.1–1.0)
Monocytes Relative: 4 %
Neutro Abs: 10 10*3/uL — ABNORMAL HIGH (ref 1.7–7.7)
Neutrophils Relative %: 81 %
Platelets: 251 10*3/uL (ref 150–400)
RBC: 4.26 MIL/uL (ref 4.22–5.81)
RDW: 13 % (ref 11.5–15.5)
WBC: 12.3 10*3/uL — ABNORMAL HIGH (ref 4.0–10.5)
nRBC: 0 % (ref 0.0–0.2)

## 2023-04-04 LAB — CBG MONITORING, ED
Glucose-Capillary: 161 mg/dL — ABNORMAL HIGH (ref 70–99)
Glucose-Capillary: 204 mg/dL — ABNORMAL HIGH (ref 70–99)

## 2023-04-04 MED ORDER — SODIUM CHLORIDE 0.9 % IV BOLUS
500.0000 mL | Freq: Once | INTRAVENOUS | Status: AC
  Administered 2023-04-04: 500 mL via INTRAVENOUS

## 2023-04-04 NOTE — ED Notes (Signed)
Unable to obtain temp and BP on pt d/t pt restless and grabbing at equipment.

## 2023-04-04 NOTE — ED Notes (Signed)
Pt ambulated to restroom with standby assist and moved bowels.

## 2023-04-04 NOTE — ED Notes (Signed)
Pt. Agitated and will not leave pulse ox on finger. RN and MD aware. Tech replaced pulse ox on finger

## 2023-04-04 NOTE — Discharge Instructions (Addendum)
Follow-up with outpatient resources for drug use. Return for new concerns.

## 2023-04-04 NOTE — ED Provider Notes (Signed)
[facility]MENT AT [facility] Provider Note   CSN: [id number] Arrival date & time: 04/04/23  1445     History  Chief Complaint  Patient presents with   Drug Overdose    Mike Small is a 57 y.o. male.  Patient presents with EMS after family reports and he admits to snorting fentanyl this morning.  Family administered 1 dose of intranasal Narcan prior to EMS arrival.  Family reports patient did not go unconscious but was concern for overdose.  Patient states he did take his insulin this morning.  Glucose was in the 400s and improved on arrival to 161.  Patient restless, mild agitated, nauseous.  Patient denies other ingestions.  Difficulty getting details in history due to presentation.  The history is provided by the patient and the EMS personnel.  Drug Overdose       Home Medications Prior to Admission medications   Medication Sig Start Date End Date Taking? Authorizing Provider  acetaminophen (TYLENOL) 500 MG tablet Take 2 tablets (1,000 mg total) by mouth every 6 (six) hours as needed. 08/25/22   Gailen Shelter, PA  calcium carbonate (TUMS) 500 MG chewable tablet Chew 2 tablets (400 mg of elemental calcium total) by mouth 2 (two) times daily as needed for indigestion or heartburn. Patient not taking: Reported on 08/27/2022 10/13/17   Georgiana Shore, PA-C  dicyclomine (BENTYL) 20 MG tablet Take 1 tablet (20 mg total) by mouth every 12 (twelve) hours as needed (abdominal pain/cramping). 10/11/17   Antony Madura, PA-C  gabapentin (NEURONTIN) 100 MG capsule Take 1 capsule (100 mg total) by mouth 2 (two) times daily. 11/16/13   Trixie Dredge, PA-C  glipiZIDE (GLUCOTROL) 5 MG tablet Take 5 mg by mouth daily. 04/13/22   [provider]  HYDROcodone-acetaminophen (NORCO/VICODIN) 5-325 MG tablet Take 1 tablet by mouth every 4 (four) hours as needed. 03/27/20   Henderly, Britni A, PA-C  ibuprofen (ADVIL) 600 MG tablet Take 1 tablet (600 mg total) by  mouth every 6 (six) hours as needed. Patient not taking: Reported on 08/27/2022 03/06/22   Elayne Snare K, DO  ibuprofen (ADVIL,MOTRIN) 600 MG tablet Take 1 tablet (600 mg total) by mouth every 6 (six) hours as needed. Patient not taking: Reported on 10/13/2017 10/25/16   Derwood Kaplan, MD  insulin glargine-yfgn (SEMGLEE) 100 UNIT/ML Pen Inject 25-30 Units into the skin. 04/13/22   [provider]  LANTUS SOLOSTAR 100 UNIT/ML Solostar Pen Inject 20 Units into the skin at bedtime. 08/15/17   [provider]  loperamide (IMODIUM) 2 MG capsule Take 1 capsule (2 mg total) by mouth 4 (four) times daily as needed for diarrhea or loose stools. Patient not taking: Reported on 08/27/2022 03/06/22   Elayne Snare K, DO  losartan (COZAAR) 50 MG tablet Take 50 mg by mouth daily. 04/20/22   [provider]  metFORMIN (GLUCOPHAGE) 500 MG tablet Take 500 mg by mouth daily. 04/13/22   [provider]  NOVOLIN N 100 UNIT/ML injection Inject 10 Units into the skin 2 (two) times daily. 08/24/17   [provider]  omeprazole (PRILOSEC) 20 MG capsule Take 1 capsule (20 mg total) by mouth daily. 10/13/17   Mathews Robinsons B, PA-C  ondansetron (ZOFRAN) 4 MG tablet Take 1 tablet (4 mg total) by mouth every 6 (six) hours. 03/06/22   Elayne Snare K, DO  ondansetron (ZOFRAN-ODT) 4 MG disintegrating tablet Take 1 tablet (4 mg total) by mouth every 8 (eight) hours  as needed for nausea or vomiting. 08/25/22   Gailen Shelter, PA  promethazine (PHENERGAN) 25 MG tablet Take 1 tablet (25 mg total) by mouth every 6 (six) hours as needed for nausea or vomiting. Patient not taking: Reported on 08/27/2022 10/11/17   Antony Madura, PA-C  tetrahydrozoline-zinc (VISINE-AC) 0.05-0.25 % ophthalmic solution Place 1 drop into both eyes as needed (dry eyes).    [provider]      Allergies    Patient has no known allergies.    Review of Systems   Review of Systems  Unable to  perform ROS: Acuity of condition    Physical Exam Updated Vital Signs BP (!) 145/87 (BP Location: Right Arm)   Pulse (!) 103   Temp 98.2 F (36.8 C)   Resp 17   SpO2 99%  Physical Exam Vitals and nursing note reviewed.  Constitutional:      General: He is not in acute distress.    Appearance: He is well-developed.  HENT:     Head: Normocephalic.     Nose: Congestion and rhinorrhea present.     Mouth/Throat:     Mouth: Mucous membranes are moist.  Eyes:     General:        Right eye: No discharge.        Left eye: No discharge.     Conjunctiva/sclera: Conjunctivae normal.  Neck:     Trachea: No tracheal deviation.  Cardiovascular:     Rate and Rhythm: Normal rate and regular rhythm.  Pulmonary:     Effort: Pulmonary effort is normal.     Breath sounds: Normal breath sounds.  Abdominal:     General: There is no distension.     Palpations: Abdomen is soft.     Tenderness: There is no abdominal tenderness. There is no guarding.  Musculoskeletal:     Cervical back: Normal range of motion and neck supple. No rigidity.  Skin:    General: Skin is warm.     Capillary Refill: Capillary refill takes less than 2 seconds.     Findings: No rash.  Neurological:     General: No focal deficit present.     Mental Status: He is alert.     GCS: GCS eye subscore is 4. GCS verbal subscore is 4. GCS motor subscore is 6.     Cranial Nerves: No cranial nerve deficit.     Comments: Patient intermittently will answer questions with a few words then after 30 seconds fall back asleep then few minutes later mild agitation.  Continues to be nasally congested snorting.  Psychiatric:     Comments:  intermittent agitation then sleepy     ED Results / Procedures / Treatments   Labs (all labs ordered are listed, but only abnormal results are displayed) Labs Reviewed  BASIC METABOLIC PANEL - Abnormal; Notable for the following components:      Result Value   Potassium 3.3 (*)    Glucose, Bld  204 (*)    All other components within normal limits  CBC WITH DIFFERENTIAL/PLATELET - Abnormal; Notable for the following components:   WBC 12.3 (*)    Neutro Abs 10.0 (*)    All other components within normal limits  CBG MONITORING, ED - Abnormal; Notable for the following components:   Glucose-Capillary 161 (*)    All other components within normal limits  CBG MONITORING, ED - Abnormal; Notable for the following components:   Glucose-Capillary 204 (*)    All other  components within normal limits  RAPID URINE DRUG SCREEN, HOSP PERFORMED    EKG None  Radiology No results found.  Procedures Procedures    Medications Ordered in ED Medications  sodium chloride 0.9 % bolus 500 mL (0 mLs Intravenous Stopped 04/04/23 1848)    ED Course/ Medical Decision Making/ A&P                                 Medical Decision Making Amount and/or Complexity of Data Reviewed Labs: ordered.   Patient presents after her known intentional ingestion of fentanyl.  Patient received Narcan however per report patient did not have significant apnea or respiratory arrest.  Patient's glucose was elevated repeated here and only mild elevated 161, later repeated 204.  Patient symptoms and signs likely secondary to overdose and withdrawal possible other ingestions as well which may include cocaine or other.  Plan for monitoring, reassessments, multiple attempts to maintain pulse ox however patient later removed.  Basic blood work ordered.  Blood work independently reviewed overall reassuring hemoglobin normal, white blood cell count minimally elevated 12,000 without signs of clinical infection.  Patient gradually improved throughout multiple reassessments.  Patient alert oriented, conversant after being observed for multiple hours in the ER.  No episodes of apnea or needing Narcan.  No indication for admission at this time.  Discussed calling family for ride home.        Final Clinical  Impression(s) / ED Diagnoses Final diagnoses:  Intentional opiate overdose, initial encounter (HCC)  Nausea    Rx / DC Orders ED Discharge Orders     None         Blane Ohara, MD 04/04/23 2231

## 2023-04-04 NOTE — ED Notes (Signed)
Tech able to reattach pulse ox. Tech explained why we have it on his finger and he said he would leave it on.

## 2023-04-04 NOTE — ED Triage Notes (Addendum)
[place] [facility] [place]. EMS initially called out for OD. Pt admits to snorting fentanyl this morning. Family administered 1 of intranasal narcan prior to EMS arrival. Family reports pt never went unconscious but was concerned for OD. Upon EMS arrival, pt's CBG was 470. Pt has hx of DM. Pt states he did not take his insulin this morning. Pt's CBG upon arrival to ED 161. Pt restless upon arrival to ED. Pt tachypnic upon arrival to ED. Pt c/o diffuse abdominal pain upon arrival to ED and nausea.   EMS: 470 CBG 140/104 97% RA 80 HR

## 2023-04-04 NOTE — ED Notes (Signed)
This RN began infusing a bag of NS in this pt's LAC. This RN was at the pt's side when the site began infiltrating.  The pt began complaining of pain.  This RN immediately stopped the infusion, removed the IV and applied an ACE wrap. I estimate no more than 25 mL entered the pt's body.  Pharmacy and the EDP were notified.

## 2024-06-15 ENCOUNTER — Encounter (HOSPITAL_COMMUNITY): Payer: Self-pay | Admitting: *Deleted

## 2024-06-15 ENCOUNTER — Other Ambulatory Visit: Payer: Self-pay

## 2024-06-15 ENCOUNTER — Emergency Department (HOSPITAL_COMMUNITY)

## 2024-06-15 ENCOUNTER — Inpatient Hospital Stay (HOSPITAL_COMMUNITY)
Admission: EM | Admit: 2024-06-15 | Discharge: 2024-06-22 | DRG: 682 | Disposition: A | Discharge status: Home / Self Care | Attending: Internal Medicine | Admitting: Internal Medicine

## 2024-06-15 DIAGNOSIS — R079 Chest pain, unspecified: Secondary | ICD-10-CM

## 2024-06-15 DIAGNOSIS — I10 Essential (primary) hypertension: Secondary | ICD-10-CM | POA: Diagnosis present

## 2024-06-15 DIAGNOSIS — E875 Hyperkalemia: Secondary | ICD-10-CM | POA: Diagnosis present

## 2024-06-15 DIAGNOSIS — F17210 Nicotine dependence, cigarettes, uncomplicated: Secondary | ICD-10-CM | POA: Diagnosis present

## 2024-06-15 DIAGNOSIS — E870 Hyperosmolality and hypernatremia: Secondary | ICD-10-CM | POA: Diagnosis present

## 2024-06-15 DIAGNOSIS — R0600 Dyspnea, unspecified: Secondary | ICD-10-CM

## 2024-06-15 DIAGNOSIS — E1165 Type 2 diabetes mellitus with hyperglycemia: Secondary | ICD-10-CM | POA: Diagnosis present

## 2024-06-15 DIAGNOSIS — K219 Gastro-esophageal reflux disease without esophagitis: Secondary | ICD-10-CM | POA: Diagnosis present

## 2024-06-15 DIAGNOSIS — J40 Bronchitis, not specified as acute or chronic: Secondary | ICD-10-CM | POA: Diagnosis present

## 2024-06-15 DIAGNOSIS — E860 Dehydration: Secondary | ICD-10-CM | POA: Diagnosis present

## 2024-06-15 DIAGNOSIS — F1410 Cocaine abuse, uncomplicated: Secondary | ICD-10-CM | POA: Diagnosis present

## 2024-06-15 DIAGNOSIS — F1110 Opioid abuse, uncomplicated: Secondary | ICD-10-CM | POA: Diagnosis present

## 2024-06-15 DIAGNOSIS — Z91148 Patient's other noncompliance with medication regimen for other reason: Secondary | ICD-10-CM

## 2024-06-15 DIAGNOSIS — D696 Thrombocytopenia, unspecified: Secondary | ICD-10-CM | POA: Diagnosis present

## 2024-06-15 DIAGNOSIS — Z7984 Long term (current) use of oral hypoglycemic drugs: Secondary | ICD-10-CM

## 2024-06-15 DIAGNOSIS — R059 Cough, unspecified: Secondary | ICD-10-CM

## 2024-06-15 DIAGNOSIS — N179 Acute kidney failure, unspecified: Principal | ICD-10-CM | POA: Diagnosis present

## 2024-06-15 DIAGNOSIS — D751 Secondary polycythemia: Secondary | ICD-10-CM | POA: Diagnosis present

## 2024-06-15 DIAGNOSIS — Z1152 Encounter for screening for COVID-19: Secondary | ICD-10-CM

## 2024-06-15 DIAGNOSIS — Z794 Long term (current) use of insulin: Secondary | ICD-10-CM

## 2024-06-15 DIAGNOSIS — J9601 Acute respiratory failure with hypoxia: Principal | ICD-10-CM | POA: Diagnosis present

## 2024-06-15 DIAGNOSIS — Z79899 Other long term (current) drug therapy: Secondary | ICD-10-CM

## 2024-06-15 DIAGNOSIS — E872 Acidosis, unspecified: Secondary | ICD-10-CM | POA: Diagnosis present

## 2024-06-15 DIAGNOSIS — R739 Hyperglycemia, unspecified: Secondary | ICD-10-CM

## 2024-06-15 DIAGNOSIS — E861 Hypovolemia: Secondary | ICD-10-CM | POA: Diagnosis present

## 2024-06-15 LAB — CBG MONITORING, ED: Glucose-Capillary: 526 mg/dL (ref 70–99)

## 2024-06-15 MED ORDER — SODIUM CHLORIDE 0.9 % IV BOLUS
1000.0000 mL | Freq: Once | INTRAVENOUS | Status: AC
  Administered 2024-06-16: 1000 mL via INTRAVENOUS

## 2024-06-15 MED ORDER — INSULIN ASPART 100 UNIT/ML IJ SOLN
10.0000 [IU] | Freq: Once | INTRAMUSCULAR | Status: AC
  Administered 2024-06-16: 10 [IU] via INTRAVENOUS
  Filled 2024-06-15: qty 10

## 2024-06-15 NOTE — ED Notes (Signed)
 RN tried for IV twice and I also tried to stick patient twice with no success.

## 2024-06-15 NOTE — ED Provider Notes (Incomplete)
 " [place] [facility] Provider Note   CSN: [id number] Arrival date & time: 06/15/24  2132     Patient presents with: Fatigue and Abdominal Pain   Mike Small is a 59 y.o. male.  {Add pertinent medical, surgical, social history, OB history to YEP:67052}  Abdominal Pain  59 year old male presenting with fatigue.  Patient and wife are able to give history.  They report that this has been going on for about 3 weeks.  In that time he has had a reported 30 pound weight loss.  Today the wife reports he had just gotten weaker and more fatigued.  He has also had some shortness of breath and abdominal pain.  However denies any constipation, diarrhea, nausea, vomiting.  He also reports that he has been a little bit dizzy.  Patient denies any chest pain.  Wife reports that she was diagnosed with flu about 3 days ago.      Prior to Admission medications  Medication Sig Start Date End Date Taking? Authorizing Provider  acetaminophen  (TYLENOL ) 500 MG tablet Take 2 tablets (1,000 mg total) by mouth every 6 (six) hours as needed. 08/25/22   Neldon Hamp RAMAN, PA  calcium  carbonate (TUMS) 500 MG chewable tablet Chew 2 tablets (400 mg of elemental calcium  total) by mouth 2 (two) times daily as needed for indigestion or heartburn. Patient not taking: Reported on 08/27/2022 10/13/17   Merilee Harlene NOVAK, PA-C  dicyclomine  (BENTYL ) 20 MG tablet Take 1 tablet (20 mg total) by mouth every 12 (twelve) hours as needed (abdominal pain/cramping). 10/11/17   Keith Sor, PA-C  gabapentin  (NEURONTIN ) 100 MG capsule Take 1 capsule (100 mg total) by mouth 2 (two) times daily. 11/16/13   Devora Perkins, PA-C  glipiZIDE  (GLUCOTROL ) 5 MG tablet Take 5 mg by mouth daily. 04/13/22   [provider]  HYDROcodone -acetaminophen  (NORCO/VICODIN) 5-325 MG tablet Take 1 tablet by mouth every 4 (four) hours as needed. 03/27/20   Henderly, Britni A, PA-C  ibuprofen  (ADVIL ) 600 MG tablet Take 1  tablet (600 mg total) by mouth every 6 (six) hours as needed. Patient not taking: Reported on 08/27/2022 03/06/22   Kingsley, Victoria K, DO  ibuprofen  (ADVIL ,MOTRIN ) 600 MG tablet Take 1 tablet (600 mg total) by mouth every 6 (six) hours as needed. Patient not taking: Reported on 10/13/2017 10/25/16   Nanavati, Ankit, MD  insulin  glargine-yfgn (SEMGLEE ) 100 UNIT/ML Pen Inject 25-30 Units into the skin. 04/13/22   [provider]  LANTUS  SOLOSTAR 100 UNIT/ML Solostar Pen Inject 20 Units into the skin at bedtime. 08/15/17   [provider]  loperamide  (IMODIUM ) 2 MG capsule Take 1 capsule (2 mg total) by mouth 4 (four) times daily as needed for diarrhea or loose stools. Patient not taking: Reported on 08/27/2022 03/06/22   Kingsley, Victoria K, DO  losartan (COZAAR) 50 MG tablet Take 50 mg by mouth daily. 04/20/22   [provider]  metFORMIN  (GLUCOPHAGE ) 500 MG tablet Take 500 mg by mouth daily. 04/13/22   [provider]  NOVOLIN N 100 UNIT/ML injection Inject 10 Units into the skin 2 (two) times daily. 08/24/17   [provider]  omeprazole  (PRILOSEC) 20 MG capsule Take 1 capsule (20 mg total) by mouth daily. 10/13/17   Mitchell, Jessica B, PA-C  ondansetron  (ZOFRAN ) 4 MG tablet Take 1 tablet (4 mg total) by mouth every 6 (six) hours. 03/06/22   Kingsley, Victoria K, DO  ondansetron  (ZOFRAN -ODT) 4 MG disintegrating tablet  Take 1 tablet (4 mg total) by mouth every 8 (eight) hours as needed for nausea or vomiting. 08/25/22   Neldon Hamp RAMAN, PA  promethazine  (PHENERGAN ) 25 MG tablet Take 1 tablet (25 mg total) by mouth every 6 (six) hours as needed for nausea or vomiting. Patient not taking: Reported on 08/27/2022 10/11/17   Keith Sor, PA-C  tetrahydrozoline-zinc (VISINE-AC) 0.05-0.25 % ophthalmic solution Place 1 drop into both eyes as needed (dry eyes).    [provider]    Allergies: Patient has no known allergies.    Review of Systems   Gastrointestinal:  Positive for abdominal pain.  All other systems reviewed and are negative.   Updated Vital Signs BP 106/81   Pulse 100   Temp (!) 97.5 F (36.4 C)   Resp 19   Ht 5' 8 (1.727 m)   Wt 95 kg   SpO2 98%   BMI 31.84 kg/m   Physical Exam Vitals and nursing note reviewed.  HENT:     Mouth/Throat:     Pharynx: Oropharynx is clear.  Cardiovascular:     Rate and Rhythm: Normal rate.     Pulses: Normal pulses.  Pulmonary:     Effort: Pulmonary effort is normal. No respiratory distress.     Breath sounds: Normal breath sounds. No wheezing.  Abdominal:     General: Abdomen is flat. Bowel sounds are normal. There is no distension.     Palpations: Abdomen is soft.     Tenderness: There is no abdominal tenderness. There is no guarding.  Skin:    General: Skin is warm and dry.  Neurological:     General: No focal deficit present.     Mental Status: He is alert.     (all labs ordered are listed, but only abnormal results are displayed) Labs Reviewed  CBG MONITORING, ED - Abnormal; Notable for the following components:      Result Value   Glucose-Capillary 526 (*)    All other components within normal limits  CBC WITH DIFFERENTIAL/PLATELET  COMPREHENSIVE METABOLIC PANEL WITH GFR  LIPASE, BLOOD  URINALYSIS, ROUTINE W REFLEX MICROSCOPIC  BETA-HYDROXYBUTYRIC ACID  PRO BRAIN NATRIURETIC PEPTIDE    EKG: None  Radiology: DG Chest 2 View Result Date: 06/15/2024 EXAM: 2 VIEW(S) XRAY OF THE CHEST 06/15/2024 11:02:00 PM COMPARISON: 08/26/2022. CLINICAL HISTORY: SOB SOB. FINDINGS: LUNGS AND PLEURA: Mild peribronchial thickening compatible with bronchitis. No focal pulmonary opacity. No pleural effusion. No pneumothorax. HEART AND MEDIASTINUM: No acute abnormality of the cardiac and mediastinal silhouettes. BONES AND SOFT TISSUES: No acute osseous abnormality. IMPRESSION: 1. Mild peribronchial thickening compatible with bronchitis. Electronically signed by: Franky Crease  MD 06/15/2024 11:37 PM EST RP Workstation: [id number]    {Document cardiac monitor, telemetry assessment procedure when appropriate:32947} Procedures   Medications Ordered in the ED - No data to display    {Click here for ABCD2, HEART and other calculators REFRESH Note before signing:1}                              Medical Decision Making    Patient presents to the ED for concern of ***, this involves an extensive number of treatment options, and is a complaint that carries with it a high risk of complications and morbidity.  The differential diagnosis includes hyperglycemia, DKA, flu, COVID, pancreatitis, HHS, dehydration, sepsis, pneumonia   Co morbidities that complicate the patient evaluation  Type 2 diabetes   Additional  history obtained:  Additional history obtained from patient and wife    External records from outside source obtained and reviewed including other outpatient notes   Lab Tests:  I Ordered, and personally interpreted labs.  The pertinent results include:  ***   Imaging Studies ordered:  I ordered imaging studies including ***  I independently visualized and interpreted imaging which showed *** I agree with the radiologist interpretation   Cardiac Monitoring:  The patient was maintained on a cardiac monitor.  I personally viewed and interpreted the cardiac monitored which showed an underlying rhythm of: ***   Medicines ordered and prescription drug management:  I ordered medication including 10 units of insulin  for hyperglycemia Reevaluation of the patient after these medicines showed that the patient {resolved/improved/worsened:23923::improved} I have reviewed the patients home medicines and have made adjustments as needed   Test Considered:  ***   Critical Interventions:  ***   Consultations Obtained:  I requested consultation with the ***,  and discussed lab and imaging findings as well as pertinent plan - they recommend:  ***   Problem List / ED Course:  ***   Reevaluation:  After the interventions noted above, I reevaluated the patient and found that they have :{resolved/improved/worsened:23923::improved}   Social Determinants of Health:  ***   Dispostion:  After consideration of the diagnostic results and the patients response to treatment, I feel that the patent would benefit from ***.    {Document critical care time when appropriate  Document review of labs and clinical decision tools ie [id number], etc  Document your independent review of radiology images and any outside records  Document your discussion with family members, caretakers and with consultants  Document social determinants of health affecting pt's care  Document your decision making why or why not admission, treatments were needed:32947:::1}   Final diagnoses:  None    ED Discharge Orders     None        "

## 2024-06-15 NOTE — ED Triage Notes (Signed)
 The pt states that his blood sugar  has been high

## 2024-06-15 NOTE — ED Provider Notes (Signed)
 " [place] [facility] Provider Note   CSN: [id number] Arrival date & time: 06/15/24  2132     Patient presents with: Fatigue and Abdominal Pain   Mike Small is a 59 y.o. male.    Abdominal Pain  59 year old male presenting with fatigue.  Patient and wife are able to give history.  They report that this has been going on for about 3 weeks.  In that time he has had a reported 30 pound weight loss.  Today the wife reports he had just gotten weaker and more fatigued.  He has also had some shortness of breath and abdominal pain.  However denies any constipation, diarrhea, nausea, vomiting.  He also reports that he has been a little bit dizzy.  Patient denies any chest pain.  Wife reports that she was diagnosed with flu about 3 days ago.      Prior to Admission medications  Medication Sig Start Date End Date Taking? Authorizing Provider  acetaminophen  (TYLENOL ) 500 MG tablet Take 2 tablets (1,000 mg total) by mouth every 6 (six) hours as needed. 08/25/22   Neldon Hamp RAMAN, PA  calcium  carbonate (TUMS) 500 MG chewable tablet Chew 2 tablets (400 mg of elemental calcium  total) by mouth 2 (two) times daily as needed for indigestion or heartburn. Patient not taking: Reported on 08/27/2022 10/13/17   Merilee Harlene NOVAK, PA-C  dicyclomine  (BENTYL ) 20 MG tablet Take 1 tablet (20 mg total) by mouth every 12 (twelve) hours as needed (abdominal pain/cramping). 10/11/17   Keith Sor, PA-C  gabapentin  (NEURONTIN ) 100 MG capsule Take 1 capsule (100 mg total) by mouth 2 (two) times daily. 11/16/13   Devora Perkins, PA-C  glipiZIDE  (GLUCOTROL ) 5 MG tablet Take 5 mg by mouth daily. 04/13/22   [provider]  HYDROcodone -acetaminophen  (NORCO/VICODIN) 5-325 MG tablet Take 1 tablet by mouth every 4 (four) hours as needed. 03/27/20   Henderly, Britni A, PA-C  ibuprofen  (ADVIL ) 600 MG tablet Take 1 tablet (600 mg total) by mouth every 6 (six) hours as needed. Patient not  taking: Reported on 08/27/2022 03/06/22   Kingsley, Victoria K, DO  ibuprofen  (ADVIL ,MOTRIN ) 600 MG tablet Take 1 tablet (600 mg total) by mouth every 6 (six) hours as needed. Patient not taking: Reported on 10/13/2017 10/25/16   Nanavati, Ankit, MD  insulin  glargine-yfgn (SEMGLEE ) 100 UNIT/ML Pen Inject 25-30 Units into the skin. 04/13/22   [provider]  LANTUS  SOLOSTAR 100 UNIT/ML Solostar Pen Inject 20 Units into the skin at bedtime. 08/15/17   [provider]  loperamide  (IMODIUM ) 2 MG capsule Take 1 capsule (2 mg total) by mouth 4 (four) times daily as needed for diarrhea or loose stools. Patient not taking: Reported on 08/27/2022 03/06/22   Kingsley, Victoria K, DO  losartan (COZAAR) 50 MG tablet Take 50 mg by mouth daily. 04/20/22   [provider]  metFORMIN  (GLUCOPHAGE ) 500 MG tablet Take 500 mg by mouth daily. 04/13/22   [provider]  NOVOLIN N 100 UNIT/ML injection Inject 10 Units into the skin 2 (two) times daily. 08/24/17   [provider]  omeprazole  (PRILOSEC) 20 MG capsule Take 1 capsule (20 mg total) by mouth daily. 10/13/17   Mitchell, Jessica B, PA-C  ondansetron  (ZOFRAN ) 4 MG tablet Take 1 tablet (4 mg total) by mouth every 6 (six) hours. 03/06/22   Kingsley, Victoria K, DO  ondansetron  (ZOFRAN -ODT) 4 MG disintegrating tablet Take 1 tablet (4 mg total) by mouth every  8 (eight) hours as needed for nausea or vomiting. 08/25/22   Neldon Hamp RAMAN, PA  promethazine  (PHENERGAN ) 25 MG tablet Take 1 tablet (25 mg total) by mouth every 6 (six) hours as needed for nausea or vomiting. Patient not taking: Reported on 08/27/2022 10/11/17   Keith Sor, PA-C  tetrahydrozoline-zinc (VISINE-AC) 0.05-0.25 % ophthalmic solution Place 1 drop into both eyes as needed (dry eyes).    [provider]    Allergies: Patient has no known allergies.    Review of Systems  Gastrointestinal:  Positive for abdominal pain.  All other systems reviewed and are  negative.   Updated Vital Signs BP 122/82   Pulse 99   Temp 98.2 F (36.8 C) (Oral)   Resp 19   Ht 5' 8 (1.727 m)   Wt 95 kg   SpO2 98%   BMI 31.84 kg/m   Physical Exam Vitals and nursing note reviewed.  HENT:     Mouth/Throat:     Pharynx: Oropharynx is clear.  Cardiovascular:     Rate and Rhythm: Normal rate.     Pulses: Normal pulses.  Pulmonary:     Effort: Pulmonary effort is normal. No respiratory distress.     Breath sounds: Normal breath sounds. No wheezing.  Chest:     Chest wall: No tenderness.  Abdominal:     General: Abdomen is flat. Bowel sounds are normal. There is no distension.     Palpations: Abdomen is soft.     Tenderness: There is no abdominal tenderness. There is no guarding.  Skin:    General: Skin is warm and dry.  Neurological:     General: No focal deficit present.     Mental Status: He is alert.  Psychiatric:     Comments: Patient was slow to answer questions.     (all labs ordered are listed, but only abnormal results are displayed) Labs Reviewed  CBC WITH DIFFERENTIAL/PLATELET - Abnormal; Notable for the following components:      Result Value   Hemoglobin 18.4 (*)    HCT 54.5 (*)    Platelets 117 (*)    Neutro Abs 8.0 (*)    All other components within normal limits  COMPREHENSIVE METABOLIC PANEL WITH GFR - Abnormal; Notable for the following components:   Sodium 151 (*)    Potassium 5.8 (*)    Glucose, Bld 599 (*)    BUN 55 (*)    Creatinine, Ser 2.39 (*)    GFR, Estimated 30 (*)    Anion gap 17 (*)    All other components within normal limits  URINALYSIS, ROUTINE W REFLEX MICROSCOPIC - Abnormal; Notable for the following components:   APPearance HAZY (*)    Glucose, UA >=500 (*)    Hgb urine dipstick SMALL (*)    Ketones, ur 5 (*)    Protein, ur 30 (*)    Bacteria, UA RARE (*)    All other components within normal limits  CBG MONITORING, ED - Abnormal; Notable for the following components:   Glucose-Capillary 526 (*)     All other components within normal limits  I-STAT CHEM 8, ED - Abnormal; Notable for the following components:   Sodium 147 (*)    Potassium 6.1 (*)    Chloride 119 (*)    BUN 72 (*)    Creatinine, Ser 2.30 (*)    Glucose, Bld 568 (*)    Calcium , Ion 0.84 (*)    Hemoglobin 17.3 (*)  All other components within normal limits  CBG MONITORING, ED - Abnormal; Notable for the following components:   Glucose-Capillary 583 (*)    All other components within normal limits  I-STAT CG4 LACTIC ACID, ED - Abnormal; Notable for the following components:   Lactic Acid, Venous 3.3 (*)    All other components within normal limits  CBG MONITORING, ED - Abnormal; Notable for the following components:   Glucose-Capillary 377 (*)    All other components within normal limits  RESP PANEL BY RT-PCR (RSV, FLU A&B, COVID)  RVPGX2  LIPASE, BLOOD  PRO BRAIN NATRIURETIC PEPTIDE  POTASSIUM  BETA-HYDROXYBUTYRIC ACID  URINE DRUG SCREEN  I-STAT VENOUS BLOOD GAS, ED    EKG: EKG Interpretation Date/Time:  Friday June 15 2024 23:30:04 EST Ventricular Rate:  100 PR Interval:  114 QRS Duration:  93 QT Interval:  389 QTC Calculation: 502 R Axis:   -68  Text Interpretation: Sinus tachycardia Biatrial enlargement Left anterior fascicular block Borderline T wave abnormalities Prolonged QT interval No significant change since last tracing Confirmed by Haze Lonni PARAS [phone]) on 06/16/2024 1:36:36 AM  Radiology: [facility] Chest 2 View Result Date: 06/15/2024 EXAM: 2 VIEW(S) XRAY OF THE CHEST 06/15/2024 11:02:00 PM COMPARISON: 08/26/2022. CLINICAL HISTORY: SOB SOB. FINDINGS: LUNGS AND PLEURA: Mild peribronchial thickening compatible with bronchitis. No focal pulmonary opacity. No pleural effusion. No pneumothorax. HEART AND MEDIASTINUM: No acute abnormality of the cardiac and mediastinal silhouettes. BONES AND SOFT TISSUES: No acute osseous abnormality. IMPRESSION: 1. Mild peribronchial thickening compatible  with bronchitis. Electronically signed by: Franky Crease MD 06/15/2024 11:37 PM EST RP Workstation: [id number]     Procedures   Medications Ordered in the ED  sodium chloride  0.9 % bolus 1,000 mL (0 mLs Intravenous Stopped 06/16/24 0229)  insulin  aspart (novoLOG ) injection 10 Units (10 Units Intravenous Given 06/16/24 0039)  lactated ringers  bolus 1,000 mL (1,000 mLs Intravenous New Bag/Given 06/16/24 0235)    Clinical Course as of 06/16/24 0315  Sat Jun 16, 2024  0031 EKG 12-Lead [EF]  7430 59 year old male who is a known diabetic presenting for fatigue and malaise.  Wife reports noncompliance with his Lantus  for the past 3 days, though he did receive 20 units last night.  Noted to be hypoxic in triage with an oxygen saturation of 80%.  He has responded well to 2 L supplemental oxygen via nasal cannula.  Has no history of chronic oxygen use.  Does report some shortness of breath with dyspnea on exertion, but denies chest pain.  His wife recently recovered from the flu.  He has not had any known fevers as an outpatient.  Does report polyuria, polydipsia.  Blood sugar elevated to 599.  While this is likely related to medication noncompliance, infection component not excluded; especially in the setting of acute hypoxemia.  Chest x-ray without obvious pneumonia.  No pneumothorax, large effusion.  His evaluation is suggestive of dehydration with new AKI, elevated Hgb. Mild anion gap acidosis multifactorial given elevated lactate, uremia. DKA felt less likely, though beta hydroxybutyrate currently pending. Receiving IVF. Also given 10u IV Novolog . [KH]  9695 Case discussed with Dr. Alfornia who will admit. [KH]    Clinical Course User Index [EF] Rosaline Almarie MATSU, PA-C [KH] Keith Sor, PA-C                                 Medical Decision Making    Patient presents to the ED  for concern of fatigue and hyperglycemia, this involves an extensive number of treatment options, and is a complaint that  carries with it a high risk of complications and morbidity.  The differential diagnosis includes hyperglycemia, DKA, flu, COVID, pancreatitis, HHS, dehydration, sepsis, pneumonia   Co morbidities that complicate the patient evaluation  Type 2 diabetes   Additional history obtained:  Additional history obtained from patient and wife    External records from outside source obtained and reviewed including other outpatient notes   Lab Tests:  I Ordered, and personally interpreted labs.  The pertinent results include: CBG was 526.  CBC showed WBC of 10.4 and a hemoglobin of 18.4.  CMP shows a potassium of 5.8.  A repeat potassium shows 4.2.  CBC shows a sodium of 151, glucose of 599, BUN of 55, creatinine of 239, GFR of 30, anion gap of 17.  Lipase was 15.  proBNP was 117.  Respiratory panel was negative for COVID, flu, RSV.  Lactic acid was 3.3.  Urinalysis showed both ketones and protein in his urine.  Imaging Studies ordered:  I ordered imaging studies including chest x-ray I independently visualized and interpreted imaging which showed bronchitis but no signs of pneumothorax or pneumonia I agree with the radiologist interpretation   Cardiac Monitoring:  The patient was maintained on a cardiac monitor.  I personally viewed and interpreted the cardiac monitored which showed an underlying rhythm of: Sinus tach   Medicines ordered and prescription drug management:  I ordered medication including  10 units of insulin  for hyperglycemia 1000 mL of sodium chloride  for dehydration 1000 mL liter of LR for dehydration Reevaluation of the patient after these medicines showed that the patient stayed the same I have reviewed the patients home medicines and have made adjustments as needed   Test Considered:  Considered a CT angiogram to check for PE however his kidney function would not allow.   Consultations Obtained:  I requested consultation with the hospitalist,  and discussed lab  and imaging findings as well as pertinent plan - they recommend: Admission   Problem List / ED Course:  59 year old male presenting with fatigue.  Patient has been noncompliant with his medication.  When his blood sugar was first checked it was found to be at 526.  Patient was also very slow to answer questions however was alert and oriented x 4.  He had not been feeling well for the past 2 weeks but his wife is what made him come in today.  Patient's initial oxygen saturation was also at 80 and was needed to be put on oxygen.  He does not wear oxygen at baseline.  After the application of oxygen patient's oxygen saturation was brought up into the upper 90s.  Patient was provided 10 units of insulin  which brought his blood sugar down to 377.  Patient's CBC and urinalysis shows signs of dehydration.  Patient was given 1000 mL of normal saline and 1000 bag of LR.  On reassessment patient reports that he is feeling a little bit better.  Patient tested negative for the flu so likely not the cause of his shortness of breath.  His chest x-ray did show signs of bronchitis.  However this is not likely the cause of his shortness of breath.  CMP showed a worsening kidney function.  The most likely cause of this is an AKI.  Because of the acute hypoxic respiratory failure and AKI patient is being admitted to the hospital.  Patient remained stable  while in the ER.   Reevaluation:  After the interventions noted above, I reevaluated the patient and found that they have :stayed the same   Social Determinants of Health:  Patient lives at home with wife.   Dispostion:  After consideration of the diagnostic results and the patients response to treatment, I feel that the patent would benefit from admission.       Final diagnoses:  Acute hypoxic respiratory failure (HCC)  AKI (acute kidney injury)  Hyperglycemia due to diabetes mellitus [facility])    ED Discharge Orders     None          Rosaline Almarie KANDICE DEVONNA 06/16/24 0415    Haze Lonni PARAS, MD 06/16/24 [phone]  "

## 2024-06-15 NOTE — ED Provider Triage Note (Signed)
 Emergency Medicine Provider Triage Evaluation Note  Mike Small , a 59 y.o. male  was evaluated in triage.  Pt complains of weakness.  Also blood sugar has been high at home.  Has not been taking his insulin  for the last few days.  Endorsing generalized abdominal pain but no nausea or vomiting.  Also states he is intermittently short of breath but no chest pain.  Review of Systems  Positive: See above Negative: See above  Physical Exam  BP (!) 104/93 (BP Location: Left Arm)   Pulse 97   Temp (!) 97.5 F (36.4 C)   Resp 19   Ht 5' 8 (1.727 m)   Wt 95 kg   SpO2 98%   BMI 31.84 kg/m  Gen:   Awake, no distress   Resp:  Normal effort  MSK:   Moves extremities without difficulty  Other:    Medical Decision Making  Medically screening exam initiated at 10:25 PM.  Appropriate orders placed.  Toribio LITTIE Molly was informed that the remainder of the evaluation will be completed by another provider, this initial triage assessment does not replace that evaluation, and the importance of remaining in the ED until their evaluation is complete.  Work up started   Lang Norleen POUR, PA-C 06/20/24 1418

## 2024-06-15 NOTE — ED Triage Notes (Signed)
 Pt arrive POV increase fatigue, poor appetite, abd pain for the past 2 weeks. Denies any nausea, vomiting or diarrhea.

## 2024-06-15 NOTE — ED Notes (Signed)
 Unsuccessful attempt at an IV and unsuccessful phlebotomy attempt.

## 2024-06-16 ENCOUNTER — Observation Stay (HOSPITAL_COMMUNITY)

## 2024-06-16 DIAGNOSIS — N179 Acute kidney failure, unspecified: Secondary | ICD-10-CM

## 2024-06-16 DIAGNOSIS — R079 Chest pain, unspecified: Secondary | ICD-10-CM

## 2024-06-16 DIAGNOSIS — R0600 Dyspnea, unspecified: Secondary | ICD-10-CM

## 2024-06-16 DIAGNOSIS — R059 Cough, unspecified: Secondary | ICD-10-CM

## 2024-06-16 DIAGNOSIS — R739 Hyperglycemia, unspecified: Secondary | ICD-10-CM

## 2024-06-16 LAB — LIPASE, BLOOD: Lipase: 50 U/L (ref 11–51)

## 2024-06-16 LAB — COMPREHENSIVE METABOLIC PANEL WITH GFR
ALT: 18 U/L (ref 0–44)
AST: 24 U/L (ref 15–41)
Albumin: 3.9 g/dL (ref 3.5–5.0)
Alkaline Phosphatase: 89 U/L (ref 38–126)
Anion gap: 17 — ABNORMAL HIGH (ref 5–15)
BUN: 55 mg/dL — ABNORMAL HIGH (ref 6–20)
CO2: 26 mmol/L (ref 22–32)
Calcium: 9.6 mg/dL (ref 8.9–10.3)
Chloride: 107 mmol/L (ref 98–111)
Creatinine, Ser: 2.39 mg/dL — ABNORMAL HIGH (ref 0.61–1.24)
GFR, Estimated: 30 mL/min — ABNORMAL LOW
Glucose, Bld: 599 mg/dL (ref 70–99)
Potassium: 5.8 mmol/L — ABNORMAL HIGH (ref 3.5–5.1)
Sodium: 151 mmol/L — ABNORMAL HIGH (ref 135–145)
Total Bilirubin: 0.7 mg/dL (ref 0.0–1.2)
Total Protein: 8 g/dL (ref 6.5–8.1)

## 2024-06-16 LAB — CBC
HCT: 49.8 % (ref 39.0–52.0)
Hemoglobin: 16.7 g/dL (ref 13.0–17.0)
MCH: 33.8 pg (ref 26.0–34.0)
MCHC: 33.5 g/dL (ref 30.0–36.0)
MCV: 100.8 fL — ABNORMAL HIGH (ref 80.0–100.0)
Platelets: 91 K/uL — ABNORMAL LOW (ref 150–400)
RBC: 4.94 MIL/uL (ref 4.22–5.81)
RDW: 12.7 % (ref 11.5–15.5)
WBC: 9.8 K/uL (ref 4.0–10.5)
nRBC: 0 % (ref 0.0–0.2)

## 2024-06-16 LAB — CBG MONITORING, ED
Glucose-Capillary: 191 mg/dL — ABNORMAL HIGH (ref 70–99)
Glucose-Capillary: 239 mg/dL — ABNORMAL HIGH (ref 70–99)
Glucose-Capillary: 301 mg/dL — ABNORMAL HIGH (ref 70–99)
Glucose-Capillary: 327 mg/dL — ABNORMAL HIGH (ref 70–99)
Glucose-Capillary: 344 mg/dL — ABNORMAL HIGH (ref 70–99)
Glucose-Capillary: 377 mg/dL — ABNORMAL HIGH (ref 70–99)
Glucose-Capillary: 583 mg/dL (ref 70–99)

## 2024-06-16 LAB — BASIC METABOLIC PANEL WITH GFR
Anion gap: 16 — ABNORMAL HIGH (ref 5–15)
Anion gap: 13 (ref 5–15)
BUN: 47 mg/dL — ABNORMAL HIGH (ref 6–20)
BUN: 46 mg/dL — ABNORMAL HIGH (ref 6–20)
CO2: 25 mmol/L (ref 22–32)
CO2: 27 mmol/L (ref 22–32)
Calcium: 9 mg/dL (ref 8.9–10.3)
Calcium: 9.5 mg/dL (ref 8.9–10.3)
Chloride: 117 mmol/L — ABNORMAL HIGH (ref 98–111)
Chloride: 119 mmol/L — ABNORMAL HIGH (ref 98–111)
Creatinine, Ser: 1.85 mg/dL — ABNORMAL HIGH (ref 0.61–1.24)
Creatinine, Ser: 1.73 mg/dL — ABNORMAL HIGH (ref 0.61–1.24)
GFR, Estimated: 41 mL/min — ABNORMAL LOW
GFR, Estimated: 45 mL/min — ABNORMAL LOW
Glucose, Bld: 391 mg/dL — ABNORMAL HIGH (ref 70–99)
Glucose, Bld: 339 mg/dL — ABNORMAL HIGH (ref 70–99)
Potassium: 4.1 mmol/L (ref 3.5–5.1)
Potassium: 4.1 mmol/L (ref 3.5–5.1)
Sodium: 158 mmol/L — ABNORMAL HIGH (ref 135–145)
Sodium: 159 mmol/L — ABNORMAL HIGH (ref 135–145)

## 2024-06-16 LAB — I-STAT CHEM 8, ED
BUN: 72 mg/dL — ABNORMAL HIGH (ref 6–20)
Calcium, Ion: 0.84 mmol/L — CL (ref 1.15–1.40)
Chloride: 119 mmol/L — ABNORMAL HIGH (ref 98–111)
Creatinine, Ser: 2.3 mg/dL — ABNORMAL HIGH (ref 0.61–1.24)
Glucose, Bld: 568 mg/dL (ref 70–99)
HCT: 51 % (ref 39.0–52.0)
Hemoglobin: 17.3 g/dL — ABNORMAL HIGH (ref 13.0–17.0)
Potassium: 6.1 mmol/L — ABNORMAL HIGH (ref 3.5–5.1)
Sodium: 147 mmol/L — ABNORMAL HIGH (ref 135–145)
TCO2: 22 mmol/L (ref 22–32)

## 2024-06-16 LAB — URINALYSIS, ROUTINE W REFLEX MICROSCOPIC
Bilirubin Urine: NEGATIVE
Glucose, UA: >=500 mg/dL — AB
Ketones, ur: 5 mg/dL — AB
Leukocytes,Ua: NEGATIVE
Nitrite: NEGATIVE
Protein, ur: 30 mg/dL — AB
Specific Gravity, Urine: 1.025 (ref 1.005–1.030)
pH: 5 (ref 5.0–8.0)

## 2024-06-16 LAB — CBC WITH DIFFERENTIAL/PLATELET
Abs Immature Granulocytes: 0.05 K/uL (ref 0.00–0.07)
Basophils Absolute: 0.1 K/uL (ref 0.0–0.1)
Basophils Relative: 1 %
Eosinophils Absolute: 0 K/uL (ref 0.0–0.5)
Eosinophils Relative: 0 %
HCT: 54.5 % — ABNORMAL HIGH (ref 39.0–52.0)
Hemoglobin: 18.4 g/dL — ABNORMAL HIGH (ref 13.0–17.0)
Immature Granulocytes: 1 %
Lymphocytes Relative: 14 %
Lymphs Abs: 1.5 K/uL (ref 0.7–4.0)
MCH: 33.6 pg (ref 26.0–34.0)
MCHC: 33.8 g/dL (ref 30.0–36.0)
MCV: 99.6 fL (ref 80.0–100.0)
Monocytes Absolute: 0.7 K/uL (ref 0.1–1.0)
Monocytes Relative: 7 %
Neutro Abs: 8 K/uL — ABNORMAL HIGH (ref 1.7–7.7)
Neutrophils Relative %: 77 %
Platelets: 117 K/uL — ABNORMAL LOW (ref 150–400)
RBC: 5.47 MIL/uL (ref 4.22–5.81)
RDW: 12.7 % (ref 11.5–15.5)
WBC: 10.4 K/uL (ref 4.0–10.5)
nRBC: 0 % (ref 0.0–0.2)

## 2024-06-16 LAB — HIV ANTIBODY (ROUTINE TESTING W REFLEX): HIV Screen 4th Generation wRfx: NONREACTIVE

## 2024-06-16 LAB — RESP PANEL BY RT-PCR (RSV, FLU A&B, COVID)  RVPGX2
Influenza A by PCR: NEGATIVE
Influenza B by PCR: NEGATIVE
Resp Syncytial Virus by PCR: NEGATIVE
SARS Coronavirus 2 by RT PCR: NEGATIVE

## 2024-06-16 LAB — LACTIC ACID, PLASMA
Lactic Acid, Venous: 2.6 mmol/L (ref 0.5–1.9)
Lactic Acid, Venous: 2.3 mmol/L (ref 0.5–1.9)

## 2024-06-16 LAB — I-STAT CG4 LACTIC ACID, ED: Lactic Acid, Venous: 3.3 mmol/L (ref 0.5–1.9)

## 2024-06-16 LAB — PRO BRAIN NATRIURETIC PEPTIDE: Pro Brain Natriuretic Peptide: 117 pg/mL

## 2024-06-16 LAB — D-DIMER, QUANTITATIVE: D-Dimer, Quant: >20 ug{FEU}/mL — ABNORMAL HIGH (ref 0.00–0.50)

## 2024-06-16 LAB — BETA-HYDROXYBUTYRIC ACID: Beta-Hydroxybutyric Acid: 2.14 mmol/L — ABNORMAL HIGH (ref 0.05–0.27)

## 2024-06-16 LAB — HEMOGLOBIN A1C
Hgb A1c MFr Bld: 14.2 % — ABNORMAL HIGH (ref 4.8–5.6)
Mean Plasma Glucose: 360.84 mg/dL

## 2024-06-16 LAB — TROPONIN T, HIGH SENSITIVITY: Troponin T High Sensitivity: 186 ng/L (ref 0–19)

## 2024-06-16 LAB — POTASSIUM: Potassium: 4.2 mmol/L (ref 3.5–5.1)

## 2024-06-16 MED ORDER — CALCIUM GLUCONATE-NACL 1-0.675 GM/50ML-% IV SOLN
1.0000 g | Freq: Once | INTRAVENOUS | Status: AC
  Administered 2024-06-16: 1000 mg via INTRAVENOUS
  Filled 2024-06-16: qty 50

## 2024-06-16 MED ORDER — LACTATED RINGERS IV BOLUS
1000.0000 mL | Freq: Once | INTRAVENOUS | Status: AC
  Administered 2024-06-16: 1000 mL via INTRAVENOUS

## 2024-06-16 MED ORDER — INSULIN ASPART 100 UNIT/ML IJ SOLN
0.0000 [IU] | INTRAMUSCULAR | Status: DC
  Administered 2024-06-16: 2 [IU] via SUBCUTANEOUS
  Administered 2024-06-16 (×3): 7 [IU] via SUBCUTANEOUS
  Administered 2024-06-16 – 2024-06-17 (×2): 3 [IU] via SUBCUTANEOUS
  Filled 2024-06-16: qty 7
  Filled 2024-06-16: qty 2
  Filled 2024-06-16: qty 3
  Filled 2024-06-16: qty 7
  Filled 2024-06-16: qty 3
  Filled 2024-06-16: qty 7

## 2024-06-16 MED ORDER — ACETAMINOPHEN 325 MG PO TABS
650.0000 mg | ORAL_TABLET | Freq: Four times a day (QID) | ORAL | Status: DC | PRN
  Administered 2024-06-16 – 2024-06-18 (×3): 650 mg via ORAL
  Filled 2024-06-16 (×3): qty 2

## 2024-06-16 MED ORDER — ACETAMINOPHEN 650 MG RE SUPP: 650.0000 mg | Freq: Four times a day (QID) | RECTAL | Status: DC | PRN

## 2024-06-16 MED ORDER — LACTATED RINGERS IV SOLN: INTRAVENOUS | Status: AC

## 2024-06-16 MED ORDER — NICOTINE 21 MG/24HR TD PT24
21.0000 mg | MEDICATED_PATCH | Freq: Every day | TRANSDERMAL | Status: DC
  Administered 2024-06-16 – 2024-06-22 (×7): 21 mg via TRANSDERMAL
  Filled 2024-06-16 (×7): qty 1

## 2024-06-16 MED ORDER — INSULIN GLARGINE 100 UNIT/ML ~~LOC~~ SOLN
10.0000 [IU] | Freq: Every day | SUBCUTANEOUS | Status: DC
  Administered 2024-06-16 – 2024-06-17 (×2): 10 [IU] via SUBCUTANEOUS
  Filled 2024-06-16 (×2): qty 0.1

## 2024-06-16 NOTE — ED Notes (Addendum)
 SABRA

## 2024-06-16 NOTE — ED Notes (Signed)
 Patient transported to CT

## 2024-06-16 NOTE — H&P (Signed)
 " History and Physical    Mike Small FMW:[id number] DOB: [birth year] DOA: 06/15/2024  PCP: Bari Morgans, NP  Patient coming from: Home  Chief Complaint: Fatigue  HPI: Mike Small is a 59 y.o. male with medical history significant of hypertension, insulin -dependent type 2 diabetes, peripheral neuropathy, GERD, history of cocaine and heroin abuse presenting with a chief complaint of fatigue.  Patient is a poor historian.  He reports feeling poorly for the past few days.  Endorsing nasal congestion, productive cough, and mild dyspnea.  Also endorsing ongoing left-sided chest pain for several days.  Denies history of blood clots.  He is endorsing generalized abdominal pain for several days associated with multiple episodes of vomiting and non-bloody diarrhea.  Patient states he has not taken insulin  for the past 3 days as he misplaced it but according to his wife patient is noncompliant and does not take any medications at home anyways.  She is concerned that his glucose was in the 500s.  She is also concerned that patient smokes 1 pack of cigarettes daily, vapes, and abuses cocaine and fentanyl.  She is concerned that the patient's oral intake has been very poor and he is losing weight.  ED Course: Vital signs on arrival: Temperature 97.5 F, pulse 97, respiratory rate 19, blood pressure 104/93, and SpO2 80% on room air.  Oxygen saturation increased to 98% with 2 L [place].  Labs showing no leukocytosis, hemoglobin 18.4 (baseline 14-16), platelet count 117k, sodium 151>147, potassium 5.8>6.1, glucose 599, bicarb 26, BUN 55, creatinine 2.4 (baseline 0.7-1.0), COVID/influenza/RSV PCR negative, lipase and LFTs normal, proBNP normal, beta hydroxybutyric acid pending, VBG pending, ionized calcium  0.84, lactic acid 3.3, UA with 5 ketones and not suggestive of infection, UDS pending.  Chest x-ray showing mild peribronchial thickening compatible with bronchitis and no focal pulmonary opacity.  Patient was given  NovoLog  10 units and 2 L of IV fluid boluses.  Potassium improved to 4.2 and glucose 377 on repeat labs.  Review of Systems:  Review of Systems  All other systems reviewed and are negative.   Past Medical History:  Diagnosis Date   DM (diabetes mellitus) (HCC)     Past Surgical History:  Procedure Laterality Date   ANKLE SURGERY     KNEE SURGERY     SHOULDER SURGERY       reports that he has quit smoking. He has never used smokeless tobacco. He reports that he does not drink alcohol and does not use drugs.  Allergies[1]  History reviewed. No pertinent family history.  Prior to Admission medications  Medication Sig Start Date End Date Taking? Authorizing Provider  acetaminophen  (TYLENOL ) 500 MG tablet Take 2 tablets (1,000 mg total) by mouth every 6 (six) hours as needed. 08/25/22   Neldon Hamp RAMAN, PA  calcium  carbonate (TUMS) 500 MG chewable tablet Chew 2 tablets (400 mg of elemental calcium  total) by mouth 2 (two) times daily as needed for indigestion or heartburn. Patient not taking: Reported on 08/27/2022 10/13/17   Merilee Harlene NOVAK, PA-C  dicyclomine  (BENTYL ) 20 MG tablet Take 1 tablet (20 mg total) by mouth every 12 (twelve) hours as needed (abdominal pain/cramping). 10/11/17   Keith Sor, PA-C  gabapentin  (NEURONTIN ) 100 MG capsule Take 1 capsule (100 mg total) by mouth 2 (two) times daily. 11/16/13   Devora Perkins, PA-C  glipiZIDE  (GLUCOTROL ) 5 MG tablet Take 5 mg by mouth daily. 04/13/22   [provider]  HYDROcodone -acetaminophen  (NORCO/VICODIN) 5-325 MG tablet Take 1 tablet  by mouth every 4 (four) hours as needed. 03/27/20   Henderly, Britni A, PA-C  ibuprofen  (ADVIL ) 600 MG tablet Take 1 tablet (600 mg total) by mouth every 6 (six) hours as needed. Patient not taking: Reported on 08/27/2022 03/06/22   Kingsley, Victoria K, DO  ibuprofen  (ADVIL ,MOTRIN ) 600 MG tablet Take 1 tablet (600 mg total) by mouth every 6 (six) hours as needed. Patient not taking: Reported  on 10/13/2017 10/25/16   Nanavati, Ankit, MD  insulin  glargine-yfgn (SEMGLEE ) 100 UNIT/ML Pen Inject 25-30 Units into the skin. 04/13/22   [provider]  LANTUS  SOLOSTAR 100 UNIT/ML Solostar Pen Inject 20 Units into the skin at bedtime. 08/15/17   [provider]  loperamide  (IMODIUM ) 2 MG capsule Take 1 capsule (2 mg total) by mouth 4 (four) times daily as needed for diarrhea or loose stools. Patient not taking: Reported on 08/27/2022 03/06/22   Kingsley, Victoria K, DO  losartan (COZAAR) 50 MG tablet Take 50 mg by mouth daily. 04/20/22   [provider]  metFORMIN  (GLUCOPHAGE ) 500 MG tablet Take 500 mg by mouth daily. 04/13/22   [provider]  NOVOLIN N 100 UNIT/ML injection Inject 10 Units into the skin 2 (two) times daily. 08/24/17   [provider]  omeprazole  (PRILOSEC) 20 MG capsule Take 1 capsule (20 mg total) by mouth daily. 10/13/17   Merilee Raisin B, PA-C  ondansetron  (ZOFRAN ) 4 MG tablet Take 1 tablet (4 mg total) by mouth every 6 (six) hours. 03/06/22   Kingsley, Victoria K, DO  ondansetron  (ZOFRAN -ODT) 4 MG disintegrating tablet Take 1 tablet (4 mg total) by mouth every 8 (eight) hours as needed for nausea or vomiting. 08/25/22   Neldon Hamp RAMAN, PA  promethazine  (PHENERGAN ) 25 MG tablet Take 1 tablet (25 mg total) by mouth every 6 (six) hours as needed for nausea or vomiting. Patient not taking: Reported on 08/27/2022 10/11/17   Keith Sor, PA-C  tetrahydrozoline-zinc (VISINE-AC) 0.05-0.25 % ophthalmic solution Place 1 drop into both eyes as needed (dry eyes).    [provider]    Physical Exam: Vitals:   06/15/24 2330 06/16/24 0000 06/16/24 0153 06/16/24 0348  BP: 106/81 122/82  110/79  Pulse: 100 99  94  Resp: 19 19  (!) 37  Temp:   98.2 F (36.8 C)   TempSrc:   Oral   SpO2: 98% 98%  96%  Weight:      Height:        Physical Exam Vitals reviewed.  Constitutional:      General: He is not in acute distress. HENT:      Head: Normocephalic and atraumatic.     Mouth/Throat:     Mouth: Mucous membranes are dry.  Eyes:     Extraocular Movements: Extraocular movements intact.  Cardiovascular:     Rate and Rhythm: Normal rate and regular rhythm.     Heart sounds: Normal heart sounds.  Pulmonary:     Effort: Pulmonary effort is normal. No respiratory distress.     Breath sounds: Normal breath sounds. No stridor. No wheezing, rhonchi or rales.  Abdominal:     General: There is no distension.     Palpations: Abdomen is soft.     Tenderness: There is abdominal tenderness. There is no guarding.     Comments: Hyperactive bowel sounds Generalized tenderness to palpation  Musculoskeletal:     Cervical back: Normal range of motion.     Right lower leg: No edema.  Left lower leg: No edema.  Skin:    General: Skin is warm and dry.  Neurological:     General: No focal deficit present.     Mental Status: He is alert and oriented to person, place, and time.     Labs on Admission: I have personally reviewed following labs and imaging studies  CBC: Recent Labs  Lab 06/16/24 0015 06/16/24 0026  WBC 10.4  --   NEUTROABS 8.0*  --   HGB 18.4* 17.3*  HCT 54.5* 51.0  MCV 99.6  --   PLT 117*  --    Basic Metabolic Panel: Recent Labs  Lab 06/16/24 0015 06/16/24 0026 06/16/24 0117  NA 151* 147*  --   K 5.8* 6.1* 4.2  CL 107 119*  --   CO2 26  --   --   GLUCOSE 599* 568*  --   BUN 55* 72*  --   CREATININE 2.39* 2.30*  --   CALCIUM  9.6  --   --    GFR: Estimated Creatinine Clearance: 38.6 mL/min (A) (by C-G formula based on SCr of 2.3 mg/dL (H)). Liver Function Tests: Recent Labs  Lab 06/16/24 0015  AST 24  ALT 18  ALKPHOS 89  BILITOT 0.7  PROT 8.0  ALBUMIN  3.9   Recent Labs  Lab 06/16/24 0015  LIPASE 50   No results for input(s): AMMONIA in the last 168 hours. Coagulation Profile: No results for input(s): INR, PROTIME in the last 168 hours. Cardiac Enzymes: No results for  input(s): CKTOTAL, CKMB, CKMBINDEX, TROPONINI in the last 168 hours. BNP (last 3 results) Recent Labs    06/16/24 0015  PROBNP 117.0   HbA1C: No results for input(s): HGBA1C in the last 72 hours. CBG: Recent Labs  Lab 06/15/24 2214 06/16/24 0020 06/16/24 0154  GLUCAP 526* 583* 377*   Lipid Profile: No results for input(s): CHOL, HDL, LDLCALC, TRIG, CHOLHDL, LDLDIRECT in the last 72 hours. Thyroid Function Tests: No results for input(s): TSH, T4TOTAL, FREET4, T3FREE, THYROIDAB in the last 72 hours. Anemia Panel: No results for input(s): VITAMINB12, FOLATE, FERRITIN, TIBC, IRON, RETICCTPCT in the last 72 hours. Urine analysis:    Component Value Date/Time   COLORURINE YELLOW 06/16/2024 0248   APPEARANCEUR HAZY (A) 06/16/2024 0248   LABSPEC 1.025 06/16/2024 0248   PHURINE 5.0 06/16/2024 0248   GLUCOSEU >=500 (A) 06/16/2024 0248   HGBUR SMALL (A) 06/16/2024 0248   BILIRUBINUR NEGATIVE 06/16/2024 0248   KETONESUR 5 (A) 06/16/2024 0248   PROTEINUR 30 (A) 06/16/2024 0248   UROBILINOGEN 0.2 11/01/2013 1131   NITRITE NEGATIVE 06/16/2024 0248   LEUKOCYTESUR NEGATIVE 06/16/2024 0248    Radiological Exams on Admission: DG Chest 2 View Result Date: 06/15/2024 EXAM: 2 VIEW(S) XRAY OF THE CHEST 06/15/2024 11:02:00 PM COMPARISON: 08/26/2022. CLINICAL HISTORY: SOB SOB. FINDINGS: LUNGS AND PLEURA: Mild peribronchial thickening compatible with bronchitis. No focal pulmonary opacity. No pleural effusion. No pneumothorax. HEART AND MEDIASTINUM: No acute abnormality of the cardiac and mediastinal silhouettes. BONES AND SOFT TISSUES: No acute osseous abnormality. IMPRESSION: 1. Mild peribronchial thickening compatible with bronchitis. Electronically signed by: Franky Crease MD 06/15/2024 11:37 PM EST RP Workstation: [id number]    EKG: Independently reviewed.  Sinus tachycardia, LAFB, QTc 502, and mild T wave abnormalities in lateral leads.  LAFB new  since previous EKG from March 2024 but otherwise no significant change.  Assessment and Plan  Hyperglycemia Insulin -dependent type 2 diabetes Diabetes very poorly controlled with last hemoglobin A1c >15.5 in March 2024  in the setting of insulin  noncompliance.  Glucose close to 600 on initial labs.  DKA less likely as bicarb normal and only 5 ketones on UA.  Beta hydroxybutyric acid level and VBG pending.  After NovoLog  10 units and IV fluids in the ED, glucose has now improved to 370s.  Start Lantus  10 units daily.  Sensitive sliding scale insulin  every 4 hours for now as keeping n.p.o. for now until CT abdomen pelvis is done (see below).  Generalized abdominal pain, vomiting, non-bloody diarrhea Lactic acidosis No leukocytosis or signs of sepsis.  Lipase and LFTs normal.  UA not suggestive of infection.  COVID and flu negative.  CT abdomen pelvis ordered for further evaluation/rule out colitis.  C. difficile PCR and GI pathogen panel, enteric precautions.  Continue IV fluid hydration and trend lactate.  Cough, mild dyspnea SpO2 reportedly 80% on room air initially.  At the time of my evaluation, patient was not wearing his nasal cannula and maintaining oxygen saturation in the high 90s on room air.  Resting comfortably, no respiratory distress.  No leukocytosis on labs.  COVID/influenza/RSV PCR negative.  Chest x-ray showing mild peribronchial thickening compatible with bronchitis and no focal pulmonary opacity.  Lungs clear on exam.  ?PE given complaint of chest pain but cannot obtain CTA due to AKI.  Check D-dimer level, and if elevated, start IV heparin  as long as there is no contraindication to anticoagulation based on CT abdomen pelvis results.  Also, if D-dimer positive, then VQ scan needs to be done in the morning.  Left-sided chest pain EKG without STEMI.  Check stat troponin and D-dimer as above.  AKI Likely prerenal in etiology from dehydration/poor p.o. intake.  BUN 55, creatinine 2.4  (baseline 0.7-1.0).  Continue IV fluid hydration and monitor renal function.  Avoid nephrotoxic agents/IV contrast.  Hyperkalemia Hyperkalemic in the setting of AKI.  Patient was given NovoLog  in the ED and potassium level improved on repeat labs.  Continue to monitor labs.  Hypernatremia Likely due to dehydration.  Continue IV fluid hydration with LR and monitor sodium level.  Erythrocytosis Dehydration possibly contributing.  Hemoglobin above baseline, monitor labs.  Mild thrombocytopenia No overt bleeding, monitor labs.  Hypocalcemia QT prolongation Replace calcium  and monitor labs.  Avoid QT prolonging drugs.  Tobacco abuse NicoDerm patch and continue to counsel to quit.  Substance abuse Per wife, patient is abusing cocaine and fentanyl.  UDS pending.  Continue to counsel to quit.  DVT prophylaxis: Avoiding anticoagulation until CT abdomen pelvis is done.  Avoiding SCDs until D-dimer is checked. Code Status: Full Code (discussed with the patient) Family Communication: Wife at bedside. Level of care: Progressive Care Unit Admission status: It is my clinical opinion that referral for OBSERVATION is reasonable and necessary in this patient based on the above information provided. The aforementioned taken together are felt to place the patient at high risk for further clinical deterioration. However, it is anticipated that the patient may be medically stable for discharge from the hospital within 24 to 48 hours.  Editha Ram MD [facility]  If 7PM-7AM, please contact night-coverage [email or web address]  06/16/2024, 4:05 AM       [1] No Known Allergies  "

## 2024-06-16 NOTE — Care Plan (Addendum)
 This 59 yrs old Male with medical history significant for hypertension, insulin -dependent type 2 diabetes, peripheral neuropathy, GERD, history of cocaine and heroin abuse presenting with a chief complaint of fatigue.  Patient has been feeling poorly for last few days.  He reports having nasal congestion, productive cough, left-sided chest pain and shortness of breath. He also reports multiple episodes of vomiting and diarrhea associated with generalized abdominal pain. Patient has not taken insulin  in the last 3 days,  states he has misplaced it but according to the wife,  patient has been noncompliant and does not take any of his medications at home.  Patient has been abusing cocaine and fentanyl, his oral intake has been very poor and he has been losing weight. In ED He was hypoxic with SpO2 of 80% on room air requiring 2 L of supplemental oxygen.  Other significant labs hemoglobin 18.4, sodium 151, potassium 5.8, glucose 599, BUN 55 creatinine 2.4.  COVID,  Influenza and RSV negative.  proBNP normal.  Lactic acid 3.3.  UA shows 5 ketones,  not suggestive of infection.  Urine drug screen is pending.  Chest x-ray shows finding compatible with bronchitis and no focal opacity.  Patient was admitted for further evaluation and started on IV fluids.   Blood glucose has been elevated within normal bicarbonate.  Patient has hyperglycemia without DKA.  VQ scan is ordered given elevated D-dimer.  Nephrology is consulted.

## 2024-06-17 DIAGNOSIS — R739 Hyperglycemia, unspecified: Secondary | ICD-10-CM | POA: Diagnosis not present

## 2024-06-17 DIAGNOSIS — F1110 Opioid abuse, uncomplicated: Secondary | ICD-10-CM | POA: Diagnosis present

## 2024-06-17 DIAGNOSIS — K219 Gastro-esophageal reflux disease without esophagitis: Secondary | ICD-10-CM | POA: Diagnosis present

## 2024-06-17 DIAGNOSIS — E875 Hyperkalemia: Secondary | ICD-10-CM | POA: Diagnosis present

## 2024-06-17 DIAGNOSIS — E872 Acidosis, unspecified: Secondary | ICD-10-CM | POA: Diagnosis present

## 2024-06-17 DIAGNOSIS — Z91148 Patient's other noncompliance with medication regimen for other reason: Secondary | ICD-10-CM | POA: Diagnosis not present

## 2024-06-17 DIAGNOSIS — E1165 Type 2 diabetes mellitus with hyperglycemia: Secondary | ICD-10-CM | POA: Diagnosis present

## 2024-06-17 DIAGNOSIS — D751 Secondary polycythemia: Secondary | ICD-10-CM | POA: Diagnosis present

## 2024-06-17 DIAGNOSIS — E860 Dehydration: Secondary | ICD-10-CM | POA: Diagnosis present

## 2024-06-17 DIAGNOSIS — E870 Hyperosmolality and hypernatremia: Secondary | ICD-10-CM | POA: Diagnosis present

## 2024-06-17 DIAGNOSIS — Z794 Long term (current) use of insulin: Secondary | ICD-10-CM | POA: Diagnosis not present

## 2024-06-17 DIAGNOSIS — J40 Bronchitis, not specified as acute or chronic: Secondary | ICD-10-CM | POA: Diagnosis present

## 2024-06-17 DIAGNOSIS — Z7984 Long term (current) use of oral hypoglycemic drugs: Secondary | ICD-10-CM | POA: Diagnosis not present

## 2024-06-17 DIAGNOSIS — Z79899 Other long term (current) drug therapy: Secondary | ICD-10-CM | POA: Diagnosis not present

## 2024-06-17 DIAGNOSIS — E861 Hypovolemia: Secondary | ICD-10-CM | POA: Diagnosis present

## 2024-06-17 DIAGNOSIS — J9601 Acute respiratory failure with hypoxia: Secondary | ICD-10-CM | POA: Diagnosis present

## 2024-06-17 DIAGNOSIS — F17210 Nicotine dependence, cigarettes, uncomplicated: Secondary | ICD-10-CM | POA: Diagnosis present

## 2024-06-17 DIAGNOSIS — N179 Acute kidney failure, unspecified: Secondary | ICD-10-CM | POA: Diagnosis present

## 2024-06-17 DIAGNOSIS — Z1152 Encounter for screening for COVID-19: Secondary | ICD-10-CM | POA: Diagnosis not present

## 2024-06-17 DIAGNOSIS — F1410 Cocaine abuse, uncomplicated: Secondary | ICD-10-CM | POA: Diagnosis present

## 2024-06-17 DIAGNOSIS — I10 Essential (primary) hypertension: Secondary | ICD-10-CM | POA: Diagnosis present

## 2024-06-17 DIAGNOSIS — D696 Thrombocytopenia, unspecified: Secondary | ICD-10-CM | POA: Diagnosis present

## 2024-06-17 LAB — CBG MONITORING, ED
Glucose-Capillary: 108 mg/dL — ABNORMAL HIGH (ref 70–99)
Glucose-Capillary: 231 mg/dL — ABNORMAL HIGH (ref 70–99)
Glucose-Capillary: 256 mg/dL — ABNORMAL HIGH (ref 70–99)
Glucose-Capillary: 265 mg/dL — ABNORMAL HIGH (ref 70–99)

## 2024-06-17 LAB — BASIC METABOLIC PANEL WITH GFR
Anion gap: 11 (ref 5–15)
Anion gap: 14 (ref 5–15)
Anion gap: 13 (ref 5–15)
BUN: 29 mg/dL — ABNORMAL HIGH (ref 6–20)
BUN: 24 mg/dL — ABNORMAL HIGH (ref 6–20)
BUN: 21 mg/dL — ABNORMAL HIGH (ref 6–20)
CO2: 28 mmol/L (ref 22–32)
CO2: 23 mmol/L (ref 22–32)
CO2: 25 mmol/L (ref 22–32)
Calcium: 9.2 mg/dL (ref 8.9–10.3)
Calcium: 8.6 mg/dL — ABNORMAL LOW (ref 8.9–10.3)
Calcium: 8.8 mg/dL — ABNORMAL LOW (ref 8.9–10.3)
Chloride: 122 mmol/L — ABNORMAL HIGH (ref 98–111)
Chloride: 117 mmol/L — ABNORMAL HIGH (ref 98–111)
Chloride: 113 mmol/L — ABNORMAL HIGH (ref 98–111)
Creatinine, Ser: 1.39 mg/dL — ABNORMAL HIGH (ref 0.61–1.24)
Creatinine, Ser: 1.22 mg/dL (ref 0.61–1.24)
Creatinine, Ser: 1.25 mg/dL — ABNORMAL HIGH (ref 0.61–1.24)
GFR, Estimated: 58 mL/min — ABNORMAL LOW
GFR, Estimated: >60 mL/min
GFR, Estimated: >60 mL/min
Glucose, Bld: 210 mg/dL — ABNORMAL HIGH (ref 70–99)
Glucose, Bld: 293 mg/dL — ABNORMAL HIGH (ref 70–99)
Glucose, Bld: 284 mg/dL — ABNORMAL HIGH (ref 70–99)
Potassium: 3.6 mmol/L (ref 3.5–5.1)
Potassium: 4.3 mmol/L (ref 3.5–5.1)
Potassium: 3.9 mmol/L (ref 3.5–5.1)
Sodium: 161 mmol/L (ref 135–145)
Sodium: 153 mmol/L — ABNORMAL HIGH (ref 135–145)
Sodium: 151 mmol/L — ABNORMAL HIGH (ref 135–145)

## 2024-06-17 LAB — GLUCOSE, CAPILLARY
Glucose-Capillary: 258 mg/dL — ABNORMAL HIGH (ref 70–99)
Glucose-Capillary: 153 mg/dL — ABNORMAL HIGH (ref 70–99)

## 2024-06-17 LAB — CBC
HCT: 48 % (ref 39.0–52.0)
Hemoglobin: 15.6 g/dL (ref 13.0–17.0)
MCH: 33.5 pg (ref 26.0–34.0)
MCHC: 32.5 g/dL (ref 30.0–36.0)
MCV: 103 fL — ABNORMAL HIGH (ref 80.0–100.0)
Platelets: 53 K/uL — ABNORMAL LOW (ref 150–400)
RBC: 4.66 MIL/uL (ref 4.22–5.81)
RDW: 12.9 % (ref 11.5–15.5)
WBC: 11.9 K/uL — ABNORMAL HIGH (ref 4.0–10.5)
nRBC: 0 % (ref 0.0–0.2)

## 2024-06-17 LAB — TROPONIN T, HIGH SENSITIVITY: Troponin T High Sensitivity: 237 ng/L (ref 0–19)

## 2024-06-17 LAB — LACTIC ACID, PLASMA: Lactic Acid, Venous: 2.5 mmol/L (ref 0.5–1.9)

## 2024-06-17 LAB — MAGNESIUM: Magnesium: 2.6 mg/dL — ABNORMAL HIGH (ref 1.7–2.4)

## 2024-06-17 LAB — BETA-HYDROXYBUTYRIC ACID: Beta-Hydroxybutyric Acid: 0.2 mmol/L (ref 0.05–0.27)

## 2024-06-17 LAB — PHOSPHORUS: Phosphorus: 1.9 mg/dL — ABNORMAL LOW (ref 2.5–4.6)

## 2024-06-17 MED ORDER — SODIUM CHLORIDE 0.45 % IV SOLN: INTRAVENOUS | Status: DC

## 2024-06-17 MED ORDER — LACTATED RINGERS IV SOLN: INTRAVENOUS | Status: DC

## 2024-06-17 MED ORDER — DEXTROSE 5 % IV SOLN: INTRAVENOUS | Status: AC

## 2024-06-17 MED ORDER — INSULIN GLARGINE-YFGN 100 UNIT/ML ~~LOC~~ SOLN
10.0000 [IU] | Freq: Every day | SUBCUTANEOUS | Status: DC
  Administered 2024-06-18: 10 [IU] via SUBCUTANEOUS
  Filled 2024-06-17: qty 0.1

## 2024-06-17 MED ORDER — INSULIN ASPART 100 UNIT/ML IJ SOLN
0.0000 [IU] | INTRAMUSCULAR | Status: DC
  Administered 2024-06-17: 3 [IU] via SUBCUTANEOUS
  Administered 2024-06-17 – 2024-06-18 (×4): 8 [IU] via SUBCUTANEOUS
  Administered 2024-06-18: 5 [IU] via SUBCUTANEOUS
  Administered 2024-06-18: 11 [IU] via SUBCUTANEOUS
  Administered 2024-06-18: 3 [IU] via SUBCUTANEOUS
  Filled 2024-06-17 (×2): qty 3
  Filled 2024-06-17: qty 9
  Filled 2024-06-17: qty 8
  Filled 2024-06-17: qty 5
  Filled 2024-06-17: qty 8
  Filled 2024-06-17: qty 11
  Filled 2024-06-17: qty 8

## 2024-06-17 NOTE — Progress Notes (Signed)
 [facility] Progress Note  Subjective:  Seen in room, no c/o's today  Presentation summary: 59 y.o. year-old w/ PMH sig for HTN, IDDM type 2 DM, neuropathy, hx of cocaine and heroin abuse presenting w/ c/o fatigue. Also congestion, prod cough, L chest pain and SOB. Also multiple episodes of N/V and diarrhea w/ associated abdominal pain. Per pt's wife, has been abusing fentanyl and cocaine, w/ very poor po intake and has been losing weight. In ED SpO2 80% on RA, better on 2L [place] O2. Hb 18, Na 151, K+ 5.8, glucose 599, bun 44, creat 2.4. COVID/ flu/ RSV negative. ProBNP wnl. LA 3.3. UA w/ 5 ketones, nothing to suggest UTI. CXR poss bronchitis, o/w no acute disease. Pt admitted and started on IVF's. BS was high w/ normal bicarb.  Creatinine baseline from 2024 was 0.8- 1.0.  We are asked to see for renal failure.   Vitals:   06/17/24 0815 06/17/24 0943 06/17/24 1115 06/17/24 1205  BP: 113/72  110/72 111/74  Pulse: 85  80 74  Resp: 20   14  Temp:  98.5 F (36.9 C)  98.1 F (36.7 C)  TempSrc:    Oral  SpO2: 96%  94% 94%  Weight:      Height:        Exam: Gen alert, no distress, thin, looks less dehydrated Sclera anicteric, throat less dry Chest clear bilat to bases RRR no MRG Abd soft ntnd no mass or ascites +bs Ext no LE edema Neuro is alert, Ox 3 , nf    Home bp meds: Losartan     UA 1/3 - prot 30, small Hb, glu > 500, ket 5, 0-5 rbc/ wbc/ epi CXR 1/02 - no acute disease CT abd w/o contrast -> No stones in the kidneys or ureters. No hydronephrosis. No perinephric or periureteral stranding. Urinary bladder is unremarkable.  CT general -> no acute disease   Assessment/ Plan:   # AKI - b/l creatinine 0.8 from oct 2024 - creatinine 2.3 on admit, down to 1.7 yest and 1.3 this am - no UOP recorded (in ED room) - UA showed no sig rbc/ wbcs, min protein, no signs of GN/ AIN - CT showed no hydronephrosis - AKI in setting of poor po intake, dehydration, vol depletion,  drug abuse - suspect AKI due to volume depletion  - yesterday IVFs resumed w/ LR at 75 cc/hr, would cont until he is drinking on his own - no further suggestions, will sign off     # Hypernatremia - water deficit w/ peak Na+ 161 last night - started IV D5W at 125 cc/hr and Na+ down to 153 this am - goal Na + about 140 - would cont D5W for now, have d/w pmd   # Volume - hypovolemic, looks a bit better today, still dry  - cont LR or NS at 75 cc/hr as above    # Hyperglycemia - glucose of 600 on initial labs - bicarb wnl and 5 ketones on UA argued against DKA - pt was given sq insulin  w/ improved BS to 300s - per pmd   # high Hgb - Hb 17-18 was most likely hemoconcentration for vol depletion - Hb coming down w/ IVF's    Myer Fret MD  [facility] 06/17/2024, 1:55 PM  Recent Labs  Lab 06/16/24 0015 06/16/24 0026 06/16/24 0540 06/16/24 0843 06/17/24 0206 06/17/24 0220 06/17/24 0606  HGB 18.4*   < > 16.7  --   --  15.6  --  ALBUMIN  3.9  --   --   --   --   --   --   CALCIUM  9.6  --  9.0   < > 9.2  --  8.6*  PHOS  --   --   --   --  1.9*  --   --   CREATININE 2.39*   < > 1.85*   < > 1.39*  --  1.22  K 5.8*   < > 4.1   < > 3.6  --  4.3   < > = values in this interval not displayed.   No results for input(s): IRON, TIBC, FERRITIN in the last 168 hours. Inpatient medications:  insulin  aspart  0-15 Units Subcutaneous Q4H   insulin  glargine  10 Units Subcutaneous Daily   nicotine   21 mg Transdermal Daily    dextrose  125 mL/hr at 06/17/24 1304   acetaminophen  **OR** acetaminophen 

## 2024-06-17 NOTE — Progress Notes (Signed)
 " PROGRESS NOTE    Mike Small  FMW:[id number] DOB: [birth year] DOA: 06/15/2024 PCP: Bari Morgans, NP   Brief Narrative:  This 59 yrs old Male with medical history significant for hypertension, insulin -dependent type 2 diabetes, peripheral neuropathy, GERD, history of cocaine and heroin abuse presenting with a chief complaint of fatigue.  Patient has been feeling poorly for last few days.  He reports having nasal congestion, productive cough, left-sided chest pain and shortness of breath. He also reports multiple episodes of vomiting and diarrhea associated with generalized abdominal pain. Patient has not taken insulin  in the last 3 days,  states he has misplaced it but according to the wife,  patient has been noncompliant and does not take any of his medications at home.  Patient has been abusing cocaine and fentanyl, his oral intake has been very poor and he has been losing weight. In ED He was hypoxic with SpO2 of 80% on room air requiring 2 L of supplemental oxygen.  Other significant labs hemoglobin 18.4, sodium 151, potassium 5.8, glucose 599, BUN 55 creatinine 2.4.  COVID,  Influenza and RSV negative.  proBNP normal.  Lactic acid 3.3.  UA shows 5 ketones,  not suggestive of infection.  Urine drug screen is pending.  Chest x-ray shows finding compatible with bronchitis and no focal opacity.  Patient was admitted for further evaluation and started on IV fluids.   Blood glucose has been elevated with normal bicarbonate.  Patient has hyperglycemia without DKA.  VQ scan is ordered given elevated D-dimer.  Nephrology is consulted.   Assessment & Plan:   Principal Problem:   Hyperglycemia Active Problems:   Cough   Dyspnea   Chest pain   AKI (acute kidney injury)   Hyperglycemia: Insulin -dependent type 2 diabetes: Patient has poorly controlled diabetes with last hemoglobin A1c >15.5 in March 2024 in the setting of insulin  noncompliance.  Glucose close to 600 on initial labs.  DKA less likely  as bicarb normal and only 5 ketones on UA.  Beta hydroxybutyric acid level 2.14. Continue Lantus  10 units daily.   Continue Sensitive sliding scale insulin  every 4 hours . Carb modified diet .  Blood glucose has been improving. 339 > 210> 293  Generalized abdominal pain, vomiting, non-bloody diarrhea: Lactic acidosis: No leukocytosis or signs of sepsis at presentation.  Lipase and LFTs normal.   UA not suggestive of infection.  COVID and flu negative.   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   C. difficile PCR and GI pathogen panel, enteric precautions.   Continue IV fluid hydration and trend lactate. Lactic acid 2.3 , Procalcitonin 0.92   Cough: SpO2 reportedly 80% on room air initially.  Subsequently weaned down to room air.  Resting comfortably, No respiratory distress.  No leukocytosis on labs.   COVID / Influenza / RSV PCR negative.   Chest x-ray showing mild peribronchial thickening compatible with bronchitis and no focal pulmonary opacity.  D-dimer is elevated. CTA was not done because of AKI.  VQ scan is ordered.    Left-sided chest pain EKG without STEMI. Troponin 186, likely in the setting of above. Trend troponin if elevated consult cardiology.   Acute kidney injury: Likely prerenal in etiology from dehydration /poor p.o. intake.   AKI resolved with IV hydration.   Hyperkalemia: Hyperkalemic in the setting of AKI.   Patient was given NovoLog  in the ED.  Potassium normalized.   Hypernatremia: Likely due to dehydration.   Continue D5W +175  cc/h for 48 hrs. Nephrology is following.  Sodium improving 161>159   Erythrocytosis: Dehydration possibly contributing.   Hemoglobin above baseline, monitor labs.   Mild thrombocytopenia: No overt bleeding, monitor labs.   Hypocalcemia: QT prolongation: Replaced and Improved, Avoid QT prolonging drugs.   Tobacco abuse: NicoDerm patch and continue to counsel to quit.    Substance abuse: Per wife, patient is abusing cocaine and fentanyl.   UDS pending.  Continue to counsel to quit.   DVT prophylaxis: SCDs Code Status: Full code Family Communication:Wife at bed side Disposition Plan:    Status is: Observation The patient remains OBS appropriate and will d/c before 2 midnights.   Patient admitted for hyperglycemia and hypernatremia with AKI.  Nephrology is consulted.  Patient is not medically ready for discharge.  Consultants:  Nephrology  Procedures: None  Antimicrobials:  Anti-infectives (From admission, onward)    None      Subjective: Patient was seen and examined at bedside.Overnight events noted. Patient reports feeling better, He started on Carb modified diet,  reports improving.   Sodium is slowly improving.   Objective: Vitals:   06/17/24 0815 06/17/24 0943 06/17/24 1115 06/17/24 1205  BP: 113/72  110/72 111/74  Pulse: 85  80 74  Resp: 20   14  Temp:  98.5 F (36.9 C)  98.1 F (36.7 C)  TempSrc:    Oral  SpO2: 96%  94% 94%  Weight:      Height:       No intake or output data in the 24 hours ending 06/17/24 1237 Filed Weights   06/15/24 2210  Weight: 95 kg    Examination:  General exam: Appears calm and comfortable, not in any acute distress.  Respiratory system: CTA bilaterally .Respiratory effort normal. RR 14 Cardiovascular system: S1 & S2 heard, RRR. No JVD, murmurs, rubs, gallops or clicks.  Gastrointestinal system: Abdomen is non distended, soft and non tender.Normal bowel sounds heard. Central nervous system: Alert and oriented x 3. No focal neurological deficits. Extremities: No edema, no cyanosis, no clubbing. Skin: No rashes, lesions or ulcers Psychiatry: Judgement and insight appear normal. Mood & affect appropriate.   Data Reviewed: I have personally reviewed following labs and imaging studies  CBC: Recent Labs  Lab 06/16/24 0015 06/16/24 0026 06/16/24 0540 06/17/24 0220  WBC 10.4  --  9.8  11.9*  NEUTROABS 8.0*  --   --   --   HGB 18.4* 17.3* 16.7 15.6  HCT 54.5* 51.0 49.8 48.0  MCV 99.6  --  100.8* 103.0*  PLT 117*  --  91* 53*   Basic Metabolic Panel: Recent Labs  Lab 06/16/24 0015 06/16/24 0026 06/16/24 0117 06/16/24 0540 06/16/24 0843 06/17/24 0206 06/17/24 0606  NA 151* 147*  --  158* 159* 161* 153*  K 5.8* 6.1* 4.2 4.1 4.1 3.6 4.3  CL 107 119*  --  117* 119* 122* 117*  CO2 26  --   --  25 27 28 23   GLUCOSE 599* 568*  --  391* 339* 210* 293*  BUN 55* 72*  --  47* 46* 29* 24*  CREATININE 2.39* 2.30*  --  1.85* 1.73* 1.39* 1.22  CALCIUM  9.6  --   --  9.0 9.5 9.2 8.6*  MG  --   --   --   --   --  2.6*  --   PHOS  --   --   --   --   --  1.9*  --  GFR: Estimated Creatinine Clearance: 72.8 mL/min (by C-G formula based on SCr of 1.22 mg/dL). Liver Function Tests: Recent Labs  Lab 06/16/24 0015  AST 24  ALT 18  ALKPHOS 89  BILITOT 0.7  PROT 8.0  ALBUMIN  3.9   Recent Labs  Lab 06/16/24 0015  LIPASE 50   No results for input(s): AMMONIA in the last 168 hours. Coagulation Profile: No results for input(s): INR, PROTIME in the last 168 hours. Cardiac Enzymes: No results for input(s): CKTOTAL, CKMB, CKMBINDEX, TROPONINI in the last 168 hours. BNP (last 3 results) Recent Labs    06/16/24 0015  PROBNP 117.0   HbA1C: Recent Labs    06/16/24 0540  HGBA1C 14.2*   CBG: Recent Labs  Lab 06/16/24 1956 06/17/24 0034 06/17/24 0413 06/17/24 0933 06/17/24 1201  GLUCAP 327* 231* 108* 256* 265*   Lipid Profile: No results for input(s): CHOL, HDL, LDLCALC, TRIG, CHOLHDL, LDLDIRECT in the last 72 hours. Thyroid Function Tests: No results for input(s): TSH, T4TOTAL, FREET4, T3FREE, THYROIDAB in the last 72 hours. Anemia Panel: No results for input(s): VITAMINB12, FOLATE, FERRITIN, TIBC, IRON, RETICCTPCT in the last 72 hours. Sepsis Labs: Recent Labs  Lab 06/16/24 0028 06/16/24 0540  06/16/24 0843  LATICACIDVEN 3.3* 2.6* 2.3*    Recent Results (from the past 240 hours)  Resp panel by RT-PCR (RSV, Flu A&B, Covid) Anterior Nasal Swab     Status: None   Collection Time: 06/15/24 11:50 PM   Specimen: Anterior Nasal Swab  Result Value Ref Range Status   SARS Coronavirus 2 by RT PCR NEGATIVE NEGATIVE Final   Influenza A by PCR NEGATIVE NEGATIVE Final   Influenza B by PCR NEGATIVE NEGATIVE Final    Comment: (NOTE) The Xpert Xpress SARS-CoV-2/FLU/RSV plus assay is intended as an aid in the diagnosis of influenza from Nasopharyngeal swab specimens and should not be used as a sole basis for treatment. Nasal washings and aspirates are unacceptable for Xpert Xpress SARS-CoV-2/FLU/RSV testing.  Fact Sheet for Patients: bloggercourse.com  Fact Sheet for Healthcare Providers: seriousbroker.it  This test is not yet approved or cleared by the [place]  FDA and has been authorized for detection and/or diagnosis of SARS-CoV-2 by FDA under an Emergency Use Authorization (EUA). This EUA will remain in effect (meaning this test can be used) for the duration of the COVID-19 declaration under Section 564(b)(1) of the Act, 21 U.S.C. section 360bbb-3(b)(1), unless the authorization is terminated or revoked.     Resp Syncytial Virus by PCR NEGATIVE NEGATIVE Final    Comment: (NOTE) Fact Sheet for Patients: bloggercourse.com  Fact Sheet for Healthcare Providers: seriousbroker.it  This test is not yet approved or cleared by the [place]  FDA and has been authorized for detection and/or diagnosis of SARS-CoV-2 by FDA under an Emergency Use Authorization (EUA). This EUA will remain in effect (meaning this test can be used) for the duration of the COVID-19 declaration under Section 564(b)(1) of the Act, 21 U.S.C. section 360bbb-3(b)(1), unless the authorization is terminated  or revoked.  Performed at [facility], [address], [place], [place] [address]     Radiology Studies: CT ABDOMEN PELVIS WO CONTRAST Result Date: 06/16/2024 EXAM: CT ABDOMEN AND PELVIS WITHOUT CONTRAST 06/16/2024 05:42:00 AM TECHNIQUE: CT of the abdomen and pelvis was performed without the administration of intravenous contrast. Multiplanar reformatted images are provided for review. Automated exposure control, iterative reconstruction, and/or weight-based adjustment of the mA/kV was utilized to reduce the radiation dose to as low as reasonably  achievable. COMPARISON: None available. CLINICAL HISTORY: Abdominal pain, acute, nonlocalized. FINDINGS: LIMITATIONS/ARTIFACTS: The exam is degraded by motion artifact. LOWER CHEST: Pleural parenchymal scarring versus subpleural subsegmental atelectasis noted within the periphery of the lung bases. LIVER: The liver is unremarkable. GALLBLADDER AND BILE DUCTS: Gallbladder is unremarkable. No biliary ductal dilatation. SPLEEN: No acute abnormality. PANCREAS: No acute abnormality. ADRENAL GLANDS: No acute abnormality. KIDNEYS, URETERS AND BLADDER: No stones in the kidneys or ureters. No hydronephrosis. No perinephric or periureteral stranding. Urinary bladder is unremarkable. GI AND BOWEL: Stomach demonstrates no acute abnormality. The appendix is visualized and appears normal. Scattered colonic diverticula identified without signs of acute diverticulitis. There is no bowel obstruction. PERITONEUM AND RETROPERITONEUM: No ascites. No free air. No free fluid or fluid collections identified within the abdomen or pelvis. VASCULATURE: Aorta is normal in caliber. Aortic atherosclerotic calcifications. LYMPH NODES: No lymphadenopathy. REPRODUCTIVE ORGANS: No acute abnormality. BONES AND SOFT TISSUES: Increased sclerosis within the subchondral humeral head suggests early avascular necrosis bilaterally. No acute osseous abnormality. No focal soft tissue abnormality.  IMPRESSION: 1. No acute findings in the abdomen or pelvis. 2. 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 Electronically signed by: Waddell Calk MD 06/16/2024 06:00 AM EST RP Workstation: [id number]   DG Chest 2 View Result Date: 06/15/2024 EXAM: 2 VIEW(S) XRAY OF THE CHEST 06/15/2024 11:02:00 PM COMPARISON: 08/26/2022. CLINICAL HISTORY: SOB SOB. FINDINGS: LUNGS AND PLEURA: Mild peribronchial thickening compatible with bronchitis. No focal pulmonary opacity. No pleural effusion. No pneumothorax. HEART AND MEDIASTINUM: No acute abnormality of the cardiac and mediastinal silhouettes. BONES AND SOFT TISSUES: No acute osseous abnormality. IMPRESSION: 1. Mild peribronchial thickening compatible with bronchitis. Electronically signed by: Franky Crease MD 06/15/2024 11:37 PM EST RP Workstation: [id number]   Scheduled Meds:  insulin  aspart  0-15 Units Subcutaneous Q4H   insulin  glargine  10 Units Subcutaneous Daily   nicotine   21 mg Transdermal Daily   Continuous Infusions:  dextrose  125 mL/hr at 06/17/24 0356   lactated ringers  100 mL/hr at 06/17/24 0404     LOS: 0 days    Time spent: 50 Mins    Darcel Dawley, MD [facility]   If 7PM-7AM, please contact night-coverage  "

## 2024-06-17 NOTE — Plan of Care (Signed)
 Hypernatremia Serum sodium is trending up 161.  Discussed with nephrology Dr.Schertz recommended to start D5W 125 cc/h. Continue to check BMP every 4 hours.  Patient patient was also history of DM type II and initial presentation blood glucose was around 600 currently which has improved to 210.  While on D5W need to monitor blood glucose very closely as high risk for development of hyperglycemic crisis again. Currently patient is on Lantus  10 units and changing sliding scale 0 to 9 units to 0 to 15 units every 4 hours to prevent hypoglycemia.   Joellen Tullos, MD [facility] 06/17/2024, 3:01 AM

## 2024-06-17 NOTE — Consult Note (Addendum)
 Renal Service Consult Note [facility] Lamar JONETTA Fret, MD  Patient: Mike Small Date: 06/17/2024 Requesting Physician: Dr. Leotis  Reason for Consult: Renal failure HPI: The patient is a 59 y.o. year-old w/ PMH sig for HTN, IDDM type 2 DM, neuropathy, hx of cocaine and heroin abuse presenting w/ c/o fatigue. Also congestion, prod cough, L chest pain and SOB. Also multiple episodes of N/V and diarrhea w/ associated abdominal pain. Per pt's wife, has been abusing fentanyl and cocaine, w/ very poor po intake and has been losing weight. In ED SpO2 80% on RA, better on 2L [place] O2. Hb 18, Na 151, K+ 5.8, glucose 599, bun 44, creat 2.4. COVID/ flu/ RSV negative. ProBNP wnl. LA 3.3. UA w/ 5 ketones, nothing to suggest UTI. CXR poss bronchitis, o/w no acute disease. Pt admitted and started on IVF's. BS was high w/ normal bicarb.  Creatinine baseline from 2024 was 0.8- 1.0.  We are asked to see for renal failure.    Pt seen in ED room. Pt remains thirsty. No other c/o's.    ROS - denies CP, no joint pain, no HA, no blurry vision, no rash, no diarrhea, no nausea/ vomiting   Past Medical History  Past Medical History:  Diagnosis Date   DM (diabetes mellitus) (HCC)    Past Surgical History  Past Surgical History:  Procedure Laterality Date   ANKLE SURGERY     KNEE SURGERY     SHOULDER SURGERY     Family History History reviewed. No pertinent family history. Social History  reports that he has quit smoking. He has never used smokeless tobacco. He reports that he does not drink alcohol and does not use drugs. Allergies Allergies[1] Home medications Prior to Admission medications  Medication Sig Start Date End Date Taking? Authorizing Provider  LANTUS  SOLOSTAR 100 UNIT/ML Solostar Pen Inject 20 Units into the skin 2 (two) times daily. 08/15/17  Yes [provider]  NOVOLIN N 100 UNIT/ML injection Inject 10 Units into the skin 2 (two) times daily. Patient not taking:  Reported on 06/16/2024 08/24/17   [provider]     Vitals:   06/16/24 2015 06/16/24 2313 06/16/24 2315 06/17/24 0045  BP: 123/79  123/83 116/82  Pulse: 95  94 95  Resp: 20  (!) 24   Temp:  99.2 F (37.3 C)    TempSrc:      SpO2: 95%  96% 98%  Weight:      Height:       Exam Gen alert, no distress, a bit thin and looks tired Sclera anicteric, throat dry  Flat neck veins Chest clear bilat to bases RRR no MRG Abd soft ntnd no mass or ascites +bs Ext no LE or UE edema, no other edema Neuro is alert, Ox 3 , nf    Home bp meds: Losartan   UA 1/3 - prot 30, small Hb, glu > 500, ket 5, 0-5 rbc/ wbc/ epi LA 3.3 just after MN last night -> 2.6 > 2.3 this am at 8 am Temp 97 --> 99.1 today WBC 9K CXR 1/02 - no acute disease CT abd w/o contrast -> No stones in the kidneys or ureters. No hydronephrosis. No perinephric or periureteral stranding. Urinary bladder is unremarkable.  CT general 1/03 -> no acute disease 1/03 labs -> alb 3.9, AG 17, CO2 26, K+ 5.8, Na 151 NS 1 L bolus, LR 1 L bolus in ED LR 125 cc/hr x 12 hrs 1/03  Assessment/ Plan:  # AKI - b/l creatinine 0.8 from oct 2024 - creatinine here was 2.30 on initial lab, now is down to 1.7 1/03 in evening - UA showed no sig rbc/ wbcs, min protein - CT showed no hydronephrosis - AKI in setting of poor po intake, dehydration, vol depletion, drug abuse - suspect AKI due to volume depletion (UA not suggestive of GN/ AIN) - will resume LR at 100 cc/ hr   # Hypernatremia - Na 151 early am 1/03 --> now is up to 159 after getting isotonic fluids - needs hypotonic IVFs (D5W) at 175 cc/hr x 48 hrs, but this rate could alter BS - have d/w MD on call -> they will start D5W at 125 cc/hr and adjust insulin  prn - needs both D5W drip and LR drip to address both the water and Na+ deficits  # Volume - remains vol depleted w/ dry mouth, flat neck veins - resuming LR at 100 cc/hr  # Hyperglycemia - glucose of almost 600 on  initial labs - bicarb wnl and 5 ketones on UA argued against DKA - pt was given sq insulin  w/ improved BS to 300s - per pmd  # high Hgb  - likely just hemoconcentration from severe volume depletion - cont IVFs      Rob Terique Kawabata  MD [facility] 06/17/2024, 2:32 AM  Recent Labs  Lab 06/16/24 0015 06/16/24 0026 06/16/24 0117 06/16/24 0540 06/16/24 0843  HGB 18.4* 17.3*  --  16.7  --   ALBUMIN  3.9  --   --   --   --   CALCIUM  9.6  --   --  9.0 9.5  CREATININE 2.39* 2.30*  --  1.85* 1.73*  K 5.8* 6.1*   < > 4.1 4.1   < > = values in this interval not displayed.   Inpatient medications:  insulin  aspart  0-9 Units Subcutaneous Q4H   insulin  glargine  10 Units Subcutaneous Daily   nicotine   21 mg Transdermal Daily    lactated ringers      acetaminophen  **OR** acetaminophen       [1] No Known Allergies

## 2024-06-17 NOTE — Progress Notes (Signed)
" ° °  Brief Progress Note   _____________________________________________________________________________________________________________  Patient Name: Mike Small Patient DOB: 1965/07/10 Date: @TODAY @      Data: Reviewed labs, VS, notes.    Action: No action needed at this time.      Response:    _____________________________________________________________________________________________________________  [facility] RN Expeditor Sharolyn JONETTA Batman Please contact us  directly via secure chat (search for [facility]) or by calling us  at [phone] [facility]).  "

## 2024-06-18 ENCOUNTER — Inpatient Hospital Stay (HOSPITAL_COMMUNITY)

## 2024-06-18 ENCOUNTER — Other Ambulatory Visit: Payer: Self-pay

## 2024-06-18 DIAGNOSIS — R739 Hyperglycemia, unspecified: Secondary | ICD-10-CM

## 2024-06-18 LAB — GLUCOSE, CAPILLARY
Glucose-Capillary: 157 mg/dL — ABNORMAL HIGH (ref 70–99)
Glucose-Capillary: 159 mg/dL — ABNORMAL HIGH (ref 70–99)
Glucose-Capillary: 171 mg/dL — ABNORMAL HIGH (ref 70–99)
Glucose-Capillary: 194 mg/dL — ABNORMAL HIGH (ref 70–99)
Glucose-Capillary: 225 mg/dL — ABNORMAL HIGH (ref 70–99)
Glucose-Capillary: 276 mg/dL — ABNORMAL HIGH (ref 70–99)
Glucose-Capillary: 312 mg/dL — ABNORMAL HIGH (ref 70–99)

## 2024-06-18 LAB — URINE DRUG SCREEN
Amphetamines: NEGATIVE
Barbiturates: NEGATIVE
Benzodiazepines: NEGATIVE
Cocaine: POSITIVE — AB
Fentanyl: POSITIVE — AB
Methadone Scn, Ur: NEGATIVE
Opiates: NEGATIVE
Tetrahydrocannabinol: NEGATIVE

## 2024-06-18 LAB — CBC
HCT: 39.7 % (ref 39.0–52.0)
Hemoglobin: 13.3 g/dL (ref 13.0–17.0)
MCH: 32.8 pg (ref 26.0–34.0)
MCHC: 33.5 g/dL (ref 30.0–36.0)
MCV: 97.8 fL (ref 80.0–100.0)
Platelets: 80 K/uL — ABNORMAL LOW (ref 150–400)
RBC: 4.06 MIL/uL — ABNORMAL LOW (ref 4.22–5.81)
RDW: 12.5 % (ref 11.5–15.5)
WBC: 10.2 K/uL (ref 4.0–10.5)
nRBC: 0 % (ref 0.0–0.2)

## 2024-06-18 LAB — CREATININE, URINE, RANDOM: Creatinine, Urine: 101 mg/dL

## 2024-06-18 LAB — HEPARIN LEVEL (UNFRACTIONATED): Heparin Unfractionated: 0.63 [IU]/mL (ref 0.30–0.70)

## 2024-06-18 LAB — SODIUM, URINE, RANDOM: Sodium, Ur: 42 mmol/L

## 2024-06-18 LAB — CALCIUM, IONIZED: Calcium, Ionized, Serum: 4.7 mg/dL (ref 4.5–5.6)

## 2024-06-18 MED ORDER — INSULIN ASPART 100 UNIT/ML IJ SOLN
0.0000 [IU] | Freq: Three times a day (TID) | INTRAMUSCULAR | Status: DC
  Administered 2024-06-18: 3 [IU] via SUBCUTANEOUS
  Administered 2024-06-19 (×2): 5 [IU] via SUBCUTANEOUS
  Administered 2024-06-19: 8 [IU] via SUBCUTANEOUS
  Administered 2024-06-20: 11 [IU] via SUBCUTANEOUS
  Administered 2024-06-20: 3 [IU] via SUBCUTANEOUS
  Administered 2024-06-20: 15 [IU] via SUBCUTANEOUS
  Administered 2024-06-21: 8 [IU] via SUBCUTANEOUS
  Administered 2024-06-21 – 2024-06-22 (×2): 5 [IU] via SUBCUTANEOUS
  Administered 2024-06-22: 3 [IU] via SUBCUTANEOUS
  Filled 2024-06-18: qty 8
  Filled 2024-06-18: qty 3
  Filled 2024-06-18: qty 15
  Filled 2024-06-18: qty 5
  Filled 2024-06-18: qty 11
  Filled 2024-06-18 (×2): qty 5
  Filled 2024-06-18: qty 3
  Filled 2024-06-18: qty 5
  Filled 2024-06-18: qty 3
  Filled 2024-06-18: qty 8

## 2024-06-18 MED ORDER — SODIUM CHLORIDE 0.9% FLUSH
10.0000 mL | Freq: Two times a day (BID) | INTRAVENOUS | Status: DC
  Administered 2024-06-18 – 2024-06-20 (×5): 10 mL
  Administered 2024-06-21: 20 mL
  Administered 2024-06-21 – 2024-06-22 (×2): 10 mL

## 2024-06-18 MED ORDER — TECHNETIUM TO 99M ALBUMIN AGGREGATED
4.4000 | Freq: Once | INTRAVENOUS | Status: AC
  Administered 2024-06-18: 4.4 via INTRAVENOUS

## 2024-06-18 MED ORDER — INSULIN GLARGINE-YFGN 100 UNIT/ML ~~LOC~~ SOLN
20.0000 [IU] | Freq: Every day | SUBCUTANEOUS | Status: DC
  Administered 2024-06-19 – 2024-06-20 (×2): 20 [IU] via SUBCUTANEOUS
  Filled 2024-06-18 (×2): qty 0.2

## 2024-06-18 MED ORDER — INSULIN ASPART 100 UNIT/ML IJ SOLN
4.0000 [IU] | Freq: Three times a day (TID) | INTRAMUSCULAR | Status: DC
  Administered 2024-06-18 – 2024-06-22 (×11): 4 [IU] via SUBCUTANEOUS
  Filled 2024-06-18 (×11): qty 4

## 2024-06-18 MED ORDER — INSULIN GLARGINE-YFGN 100 UNIT/ML ~~LOC~~ SOLN: 15.0000 [IU] | Freq: Every day | SUBCUTANEOUS | Status: DC

## 2024-06-18 MED ORDER — SODIUM CHLORIDE 0.9% FLUSH: 10.0000 mL | INTRAVENOUS | Status: DC | PRN

## 2024-06-18 MED ORDER — DEXTROSE 5 % IV SOLN: INTRAVENOUS | Status: AC

## 2024-06-18 MED ORDER — CHLORHEXIDINE GLUCONATE CLOTH 2 % EX PADS
6.0000 | MEDICATED_PAD | Freq: Every day | CUTANEOUS | Status: DC
  Administered 2024-06-18 – 2024-06-21 (×4): 6 via TOPICAL

## 2024-06-18 MED ORDER — HEPARIN BOLUS VIA INFUSION
4600.0000 [IU] | Freq: Once | INTRAVENOUS | Status: AC
  Administered 2024-06-18: 4600 [IU] via INTRAVENOUS
  Filled 2024-06-18: qty 4600

## 2024-06-18 MED ORDER — HEPARIN (PORCINE) 25000 UT/250ML-% IV SOLN
1300.0000 [IU]/h | INTRAVENOUS | Status: DC
  Administered 2024-06-18 – 2024-06-19 (×2): 1300 [IU]/h via INTRAVENOUS
  Filled 2024-06-18 (×2): qty 250

## 2024-06-18 NOTE — Plan of Care (Signed)

## 2024-06-18 NOTE — Progress Notes (Signed)
 PHARMACY - ANTICOAGULATION CONSULT NOTE  Pharmacy Consult for heparin  Indication: pulmonary embolus  Allergies[1]  Patient Measurements: Height: 5' 8 (172.7 cm) Weight: 76 kg (167 lb 8.8 oz) IBW/kg (Calculated) : 68.4 HEPARIN  DW (KG): 76.6  Vital Signs: BP: 108/64 (01/05 1525)  Labs: Recent Labs    06/16/24 0540 06/16/24 0843 06/17/24 0206 06/17/24 0220 06/17/24 0606 06/17/24 1419 06/18/24 2115  HGB 16.7  --   --  15.6  --   --  13.3  HCT 49.8  --   --  48.0  --   --  39.7  PLT 91*  --   --  53*  --   --  80*  HEPARINUNFRC  --   --   --   --   --   --  0.63  CREATININE 1.85*   < > 1.39*  --  1.22 1.25*  --    < > = values in this interval not displayed.    Estimated Creatinine Clearance: 61.6 mL/min (A) (by C-G formula based on SCr of 1.25 mg/dL (H)).   Medical History: Past Medical History:  Diagnosis Date   DM (diabetes mellitus) (HCC)     Medications:  Medications Prior to Admission  Medication Sig Dispense Refill Last Dose/Taking   LANTUS  SOLOSTAR 100 UNIT/ML Solostar Pen Inject 20 Units into the skin 2 (two) times daily.  5 06/15/2024 Bedtime   NOVOLIN N 100 UNIT/ML injection Inject 10 Units into the skin 2 (two) times daily. (Patient not taking: Reported on 06/16/2024)  5 Not Taking    Assessment: 25 yom admitted with fatigue, N/V/D and abdominal pain found to have AKI. PMH includes substance abuse, HTN, T2DM, neuropathy, GERD. D-dimer was elevated at >20. VQ scan reveals high probability PE in bilateral upper lobes. Pharmacy consulted to begin IV heparin . No AC PTA noted. SCr is trending down. No bleeding noted, Hgb is normal, platelets are low (251 03/2023). Patient has a history of medication noncompliance.   Pt now with PICC line placed by IV team. Heparin  running in peripheral line. Labs drawn from PICC.  Heparin  level therapeutic (0.63) on infusion at 1300 units/hr. No bleeding noted. H/H stable. Plt 80 (improving)  Goal of Therapy:  Heparin  level  0.3-0.7 units/ml Monitor platelets by anticoagulation protocol: Yes   Plan:  Continue heparin  at 1300 units/hr F/u a.m. heparin  level to confirm  Thank you for involving pharmacy in this patient's care.  Vito Ralph, PharmD, BCPS Please see amion for complete clinical pharmacist phone list 06/18/2024 10:44 PM         [1] No Known Allergies

## 2024-06-18 NOTE — Plan of Care (Addendum)
 Patient and his family declining  lab draw.  They want time to think about it to allow for more lab draw.   Patient's family requesting for PICC line.  Informed RN and explained to patient that there is high risk for development of infection via PICC line as there is no indication for PICC line as well given he is not receiving any long-term IV medications or TPN via PICC line.  Patient and family want to think about it and declining to lab draw.   Amarri Satterly, MD [facility] 06/18/2024, 3:47 AM

## 2024-06-18 NOTE — Progress Notes (Addendum)
 " PROGRESS NOTE  Mike Small  FMW:[id number] DOB: 08-03-1965 DOA: 06/15/2024 PCP: Bari Morgans, NP   Brief Narrative: This 59 yrs old Male with medical history significant for hypertension, insulin -dependent type 2 diabetes, peripheral neuropathy, GERD, history of cocaine and heroin abuse presenting with a chief complaint of fatigue .  Also reported multiple episodes of vomiting, diarrhea, abdominal pain.  Report of abusing cocaine, fentanyl.  On presentation, sodium was 151, creatinine was 2.4.  Started on IV fluids with D5.  Nephrology consulted.  Chest x-ray showed bronchitis, no focal opacity.  Patient refusing blood work.  Assessment & Plan:  Principal Problem:   Hyperglycemia Active Problems:   Cough   Dyspnea   Chest pain   AKI (acute kidney injury)   Acute hypoxemic respiratory failure (HCC)   Hyperglycemia: Insulin -dependent type 2 diabetes: Patient has poorly controlled diabetes with last hemoglobin A1c >15.5 in March 2024 in the setting of insulin  noncompliance.  Glucose close to 600 on initial labs.  DKA less likely as bicarb normal and only 5 ketones on UA.  Beta hydroxybutyric acid level 2.14. Continue Lantus  10 units daily.   Continue Sensitive sliding scale insulin  every 4 hours . Carb modified diet .  Diabetic coordinator  consulted   Generalized abdominal pain, vomiting, non-bloody diarrhea: Lactic acidosis: No leukocytosis or signs of sepsis at presentation.  Lipase and LFTs normal.   UA not suggestive of infection.  COVID and flu negative.   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   C. difficile PCR and GI pathogen panel ordered  Continue IV fluid hydration and trend lactate. Lactic acid 2.3 , Procalcitonin 0.92 No abdominal pain, nausea or vomiting today.   Cough: SpO2 reportedly 80% on room air initially.  Subsequently weaned down to room air.  Resting comfortably, No respiratory distress.  No leukocytosis on  labs.   COVID / Influenza / RSV PCR negative.   Chest x-ray showing mild peribronchial thickening compatible with bronchitis and no focal pulmonary opacity.  D-dimer is elevated. CTA was not done because of AKI.  VQ scan is ordered.     Left-sided chest pain EKG without STEMI. Troponin 186, likely in the setting of above. Trend troponin if elevated consult cardiology.   Acute kidney injury: Likely prerenal in etiology from dehydration /poor p.o. intake.   AKI resolved with IV hydration. Nephrology was following.   Hyperkalemia: Hyperkalemic in the setting of AKI.   Patient was given NovoLog  in the ED.  Potassium normalized.   Hypernatremia: Likely due to dehydration.   Continue D5W . Nephrology was following.  Sodium improving 161>159     Mild thrombocytopenia: No overt bleeding, monitor labs.   Hypocalcemia: QT prolongation: Replaced and Improved, Avoid QT prolonging drugs.   Tobacco abuse: NicoDerm patch and continue to counsel to quit.   Substance abuse: Per wife, patient is abusing cocaine and fentanyl.   UDS positive for cocaine, fentanyl   Addendum:  VQ scan test was ordered because of cough, hypoxia.  VQ scan just came out to be positive for high probability for PE.  Started on heparin  drip.       DVT prophylaxis:     Code Status: Full Code  Family Communication: Wife at bedside on 1/5  Patient status: Inpatient  Patient is from : Home  Anticipated discharge to: Home  Estimated DC date: Tomorrow   Consultants: None  Procedures: None  Antimicrobials:  Anti-infectives (From admission, onward)    None  Subjective: Patient seen and examined at bedside today.  He was lying in bed.  He was slightly sleepy/drowsy.  He was on room air.  Denies shortness of breath or cough.  Complains of generalised weakness.  No abdomen, nausea or vomiting.  He was refusing to draw labs.  Long discussion held at bedside with the wife and the patient about  the importance of blood work to direct the treatment plan  Objective: Vitals:   06/17/24 1928 06/18/24 0018 06/18/24 0412 06/18/24 0755  BP: 114/82 122/81 104/67 112/78  Pulse: 66 72 73   Resp: 17 18 18 17   Temp: 98.1 F (36.7 C) 98.3 F (36.8 C) 98.2 F (36.8 C) 98 F (36.7 C)  TempSrc: Oral Oral Oral Oral  SpO2: 92% 94% 93% 96%  Weight:   76 kg   Height:        Intake/Output Summary (Last 24 hours) at 06/18/2024 1148 Last data filed at 06/17/2024 2058 Gross per 24 hour  Intake 120 ml  Output --  Net 120 ml   Filed Weights   06/15/24 2210 06/17/24 1607 06/18/24 0412  Weight: 95 kg 76.6 kg 76 kg    Examination:  General exam: Overall comfortable, not in distress, sleepy/drowsy HEENT: PERRL Respiratory system:  no wheezes or crackles  Cardiovascular system: S1 & S2 heard, RRR.  Gastrointestinal system: Abdomen is nondistended, soft and nontender. Central nervous system: Alert and oriented Extremities: No edema, no clubbing ,no cyanosis Skin: No rashes, no ulcers,no icterus     Data Reviewed: I have personally reviewed following labs and imaging studies  CBC: Recent Labs  Lab 06/16/24 0015 06/16/24 0026 06/16/24 0540 06/17/24 0220  WBC 10.4  --  9.8 11.9*  NEUTROABS 8.0*  --   --   --   HGB 18.4* 17.3* 16.7 15.6  HCT 54.5* 51.0 49.8 48.0  MCV 99.6  --  100.8* 103.0*  PLT 117*  --  91* 53*   Basic Metabolic Panel: Recent Labs  Lab 06/16/24 0540 06/16/24 0843 06/17/24 0206 06/17/24 0606 06/17/24 1419  NA 158* 159* 161* 153* 151*  K 4.1 4.1 3.6 4.3 3.9  CL 117* 119* 122* 117* 113*  CO2 25 27 28 23 25   GLUCOSE 391* 339* 210* 293* 284*  BUN 47* 46* 29* 24* 21*  CREATININE 1.85* 1.73* 1.39* 1.22 1.25*  CALCIUM  9.0 9.5 9.2 8.6* 8.8*  MG  --   --  2.6*  --   --   PHOS  --   --  1.9*  --   --      Recent Results (from the past 240 hours)  Resp panel by RT-PCR (RSV, Flu A&B, Covid) Anterior Nasal Swab     Status: None   Collection Time: 06/15/24 11:50  PM   Specimen: Anterior Nasal Swab  Result Value Ref Range Status   SARS Coronavirus 2 by RT PCR NEGATIVE NEGATIVE Final   Influenza A by PCR NEGATIVE NEGATIVE Final   Influenza B by PCR NEGATIVE NEGATIVE Final    Comment: (NOTE) The Xpert Xpress SARS-CoV-2/FLU/RSV plus assay is intended as an aid in the diagnosis of influenza from Nasopharyngeal swab specimens and should not be used as a sole basis for treatment. Nasal washings and aspirates are unacceptable for Xpert Xpress SARS-CoV-2/FLU/RSV testing.  Fact Sheet for Patients: bloggercourse.com  Fact Sheet for Healthcare Providers: seriousbroker.it  This test is not yet approved or cleared by the [place]  FDA and has been authorized for detection and/or diagnosis  of SARS-CoV-2 by FDA under an Emergency Use Authorization (EUA). This EUA will remain in effect (meaning this test can be used) for the duration of the COVID-19 declaration under Section 564(b)(1) of the Act, 21 U.S.C. section 360bbb-3(b)(1), unless the authorization is terminated or revoked.     Resp Syncytial Virus by PCR NEGATIVE NEGATIVE Final    Comment: (NOTE) Fact Sheet for Patients: bloggercourse.com  Fact Sheet for Healthcare Providers: seriousbroker.it  This test is not yet approved or cleared by the [place]  FDA and has been authorized for detection and/or diagnosis of SARS-CoV-2 by FDA under an Emergency Use Authorization (EUA). This EUA will remain in effect (meaning this test can be used) for the duration of the COVID-19 declaration under Section 564(b)(1) of the Act, 21 U.S.C. section 360bbb-3(b)(1), unless the authorization is terminated or revoked.  Performed at [facility], [address] [place], [place] [address]      Radiology Studies: DG CHEST PORT 1 VIEW Result Date: 06/18/2024 CLINICAL DATA:  Hypoxia. EXAM: PORTABLE  CHEST 1 VIEW COMPARISON:  06/15/2024 FINDINGS: The heart size and mediastinal contours are within normal limits. Low lung volumes noted. New mild bibasilar atelectasis. No evidence of focal consolidation or pleural effusion. IMPRESSION: Low lung volumes and mild bibasilar atelectasis. Electronically Signed   By: Norleen DELENA Kil M.D.   On: 06/18/2024 09:32    Scheduled Meds:  insulin  aspart  0-15 Units Subcutaneous Q4H   insulin  glargine-yfgn  10 Units Subcutaneous Daily   nicotine   21 mg Transdermal Daily   Continuous Infusions:  dextrose  125 mL/hr at 06/18/24 0332   lactated ringers  125 mL/hr at 06/18/24 0624     LOS: 1 day   Ivonne Mustache, MD [facility] P1/10/2024, 11:48 AM  "

## 2024-06-18 NOTE — Progress Notes (Signed)
 PHARMACY - ANTICOAGULATION CONSULT NOTE  Pharmacy Consult for heparin  Indication: pulmonary embolus  Allergies[1]  Patient Measurements: Height: 5' 8 (172.7 cm) Weight: 76 kg (167 lb 8.8 oz) IBW/kg (Calculated) : 68.4 HEPARIN  DW (KG): 76.6  Vital Signs: Temp: 98 F (36.7 C) (01/05 0755) Temp Source: Oral (01/05 0755) BP: 103/67 (01/05 1145) Pulse Rate: 73 (01/05 0412)  Labs: Recent Labs    06/16/24 0015 06/16/24 0026 06/16/24 0540 06/16/24 0843 06/17/24 0206 06/17/24 0220 06/17/24 0606 06/17/24 1419  HGB 18.4* 17.3* 16.7  --   --  15.6  --   --   HCT 54.5* 51.0 49.8  --   --  48.0  --   --   PLT 117*  --  91*  --   --  53*  --   --   CREATININE 2.39* 2.30* 1.85*   < > 1.39*  --  1.22 1.25*   < > = values in this interval not displayed.    Estimated Creatinine Clearance: 61.6 mL/min (A) (by C-G formula based on SCr of 1.25 mg/dL (H)).   Medical History: Past Medical History:  Diagnosis Date   DM (diabetes mellitus) (HCC)     Medications:  Medications Prior to Admission  Medication Sig Dispense Refill Last Dose/Taking   LANTUS  SOLOSTAR 100 UNIT/ML Solostar Pen Inject 20 Units into the skin 2 (two) times daily.  5 06/15/2024 Bedtime   NOVOLIN N 100 UNIT/ML injection Inject 10 Units into the skin 2 (two) times daily. (Patient not taking: Reported on 06/16/2024)  5 Not Taking    Assessment: 47 yom admitted with fatigue, N/V/D and abdominal pain found to have AKI. PMH includes substance abuse, HTN, T2DM, neuropathy, GERD. D-dimer was elevated at >20. VQ scan reveals high probability PE in bilateral upper lobes. Pharmacy consulted to begin IV heparin . No AC PTA noted. SCr is trending down. No bleeding noted, Hgb is normal, platelets are low (251 03/2023). Patient has a history of medication noncompliance.   Goal of Therapy:  Heparin  level 0.3-0.7 units/ml Monitor platelets by anticoagulation protocol: Yes   Plan:  Heparin  4600 unit IV bolus then infuse at 1300  units/hr 6 hr heparin  level Daily heparin  level and CBC Monitor for signs/symptoms of bleeding  Thank you for involving pharmacy in this patient's care.  Delon Sax, PharmD, BCPS Clinical Pharmacist Clinical phone for 06/18/2024 is [phone] 06/18/2024 12:44 PM        [1] No Known Allergies

## 2024-06-18 NOTE — Progress Notes (Signed)
 Arrived at bedside for PICC placement.  Procedural verification done.  Upon starting to explain the procedure to the patient he requested to wait until after he eats, patient made aware that the PICC is essential to his care due to the inability to obtain lab work today.  Patient states that he wishes to wait until after he eats dinner.  Patient and bedside nurse made aware that PICC nurse must move to the next patient and it will be at least 1.5 hours before able to return to place.  Patient states that he is okay with this.

## 2024-06-18 NOTE — Progress Notes (Signed)
 Peripherally Inserted Central Catheter Placement  The IV Nurse has discussed with the patient and/or persons authorized to consent for the patient, the purpose of this procedure and the potential benefits and risks involved with this procedure.  The benefits include less needle sticks, lab draws from the catheter, and the patient may be discharged home with the catheter. Risks include, but not limited to, infection, bleeding, blood clot (thrombus formation), and puncture of an artery; nerve damage and irregular heartbeat and possibility to perform a PICC exchange if needed/ordered by physician.  Alternatives to this procedure were also discussed.  Bard Power PICC patient education guide, fact sheet on infection prevention and patient information card has been provided to patient /or left at bedside.    PICC Placement Documentation  PICC Double Lumen 06/18/24 Right Basilic 42 cm 0 cm (Active)  Indication for Insertion or Continuance of Line Poor Vasculature-patient has had multiple peripheral attempts or PIVs lasting less than 24 hours 06/18/24 2114  Exposed Catheter (cm) 0 cm 06/18/24 2114  Site Assessment Clean, Dry, Intact 06/18/24 2114  Lumen #1 Status Flushed;Saline locked;Blood return noted 06/18/24 2114  Lumen #2 Status Flushed;Saline locked;Blood return noted 06/18/24 2114  Dressing Type Transparent;Securing device 06/18/24 2114  Dressing Status Antimicrobial disc/dressing in place;Clean, Dry, Intact 06/18/24 2114  Line Care Connections checked and tightened 06/18/24 2114  Line Adjustment (NICU/IV Team Only) No 06/18/24 2114  Dressing Intervention Adhesive placed at insertion site (IV team only);New dressing 06/18/24 2114  Dressing Change Due 06/25/24 06/18/24 2114       Elester Apodaca, Cherene Place 06/18/2024, 9:19 PM

## 2024-06-18 NOTE — Inpatient Diabetes Management (Signed)
 Inpatient Diabetes Program Recommendations  AACE/ADA: New Consensus Statement on Inpatient Glycemic Control (2015)  Target Ranges:  Prepandial:   less than 140 mg/dL      Peak postprandial:   less than 180 mg/dL (1-2 hours)      Critically ill patients:  140 - 180 mg/dL   Lab Results  Component Value Date   GLUCAP 312 (H) 06/18/2024   HGBA1C 14.2 (H) 06/16/2024    Review of Glycemic Control  Latest Reference Range & Units 06/18/24 00:16 06/18/24 04:10 06/18/24 09:41 06/18/24 11:41  Glucose-Capillary 70 - 99 mg/dL 828 (H) 774 (H) 723 (H) 312 (H)   Diabetes history: DM 2 Outpatient Diabetes medications:  Lantus  20 units daily?  Current orders for Inpatient glycemic control:  Novolog  0-15 units tq 4 hours Semglee  15 units daily  Inpatient Diabetes Program Recommendations:    Note A1C is 14.2%. Spoke to patient and his wife regarding DM management.  They state that he takes Lantus  and Novolin at home.  He has a meter but does not check his blood sugars.  He states that he often misses insulin  doses and does not know what his blood sugars are.  He see's NP every 6 months regarding DM, but states no changes have been made to his insulins/medications.  Discussed glucose goals of 100-180 mg/dL.  Also discussed importance of regular follow up and checking CBG's at home.  Briefly discussed long term consequences of not managing blood sugars including blindness, kidney failure, heart problems, etc.  Likely will need changes made to home insulin  regimen based on CBG's/ hospital management.   -Please consider changing Novolog  to tid with meals and HS scale.  Consider adding Novolog  4 units tid with meals (meal coverage).  Also consider increasing Semglee  to 20 units daily.  He will need new prescriptions and new meter at discharge as well.   Thanks,  Randall Bullocks, RN, BC-ADM Inpatient Diabetes Coordinator Pager [phone]  (8a-5p)

## 2024-06-18 NOTE — Progress Notes (Signed)
 Okay to place PICC per Dr. Gearline with nephrology.

## 2024-06-19 ENCOUNTER — Telehealth (HOSPITAL_COMMUNITY): Payer: Self-pay

## 2024-06-19 ENCOUNTER — Other Ambulatory Visit (HOSPITAL_COMMUNITY): Payer: Self-pay

## 2024-06-19 DIAGNOSIS — R739 Hyperglycemia, unspecified: Secondary | ICD-10-CM | POA: Diagnosis not present

## 2024-06-19 LAB — BASIC METABOLIC PANEL WITH GFR
Anion gap: 8 (ref 5–15)
BUN: 10 mg/dL (ref 6–20)
CO2: 26 mmol/L (ref 22–32)
Calcium: 7.7 mg/dL — ABNORMAL LOW (ref 8.9–10.3)
Chloride: 109 mmol/L (ref 98–111)
Creatinine, Ser: 0.93 mg/dL (ref 0.61–1.24)
GFR, Estimated: >60 mL/min
Glucose, Bld: 335 mg/dL — ABNORMAL HIGH (ref 70–99)
Potassium: 3.6 mmol/L (ref 3.5–5.1)
Sodium: 143 mmol/L (ref 135–145)

## 2024-06-19 LAB — CBC
HCT: 38.7 % — ABNORMAL LOW (ref 39.0–52.0)
Hemoglobin: 13.1 g/dL (ref 13.0–17.0)
MCH: 32.8 pg (ref 26.0–34.0)
MCHC: 33.9 g/dL (ref 30.0–36.0)
MCV: 97 fL (ref 80.0–100.0)
Platelets: 105 K/uL — ABNORMAL LOW (ref 150–400)
RBC: 3.99 MIL/uL — ABNORMAL LOW (ref 4.22–5.81)
RDW: 12.4 % (ref 11.5–15.5)
WBC: 11.3 K/uL — ABNORMAL HIGH (ref 4.0–10.5)
nRBC: 0 % (ref 0.0–0.2)

## 2024-06-19 LAB — GLUCOSE, CAPILLARY
Glucose-Capillary: 283 mg/dL — ABNORMAL HIGH (ref 70–99)
Glucose-Capillary: 250 mg/dL — ABNORMAL HIGH (ref 70–99)
Glucose-Capillary: 242 mg/dL — ABNORMAL HIGH (ref 70–99)
Glucose-Capillary: 227 mg/dL — ABNORMAL HIGH (ref 70–99)
Glucose-Capillary: 239 mg/dL — ABNORMAL HIGH (ref 70–99)

## 2024-06-19 LAB — LACTIC ACID, PLASMA: Lactic Acid, Venous: 1 mmol/L (ref 0.5–1.9)

## 2024-06-19 LAB — HEPARIN LEVEL (UNFRACTIONATED): Heparin Unfractionated: 0.5 [IU]/mL (ref 0.30–0.70)

## 2024-06-19 MED ORDER — APIXABAN 5 MG PO TABS: 5.0000 mg | ORAL_TABLET | Freq: Two times a day (BID) | ORAL | Status: DC

## 2024-06-19 MED ORDER — APIXABAN 5 MG PO TABS
10.0000 mg | ORAL_TABLET | Freq: Two times a day (BID) | ORAL | Status: DC
  Administered 2024-06-19 – 2024-06-22 (×7): 10 mg via ORAL
  Filled 2024-06-19 (×5): qty 2
  Filled 2024-06-19: qty 4
  Filled 2024-06-19: qty 2

## 2024-06-19 NOTE — Progress Notes (Addendum)
 PHARMACY - ANTICOAGULATION CONSULT NOTE  Pharmacy Consult for heparin  >> Eliquis   Indication: pulmonary embolus  Allergies[1]  Patient Measurements: Height: 5' 8 (172.7 cm) Weight: 82 kg (180 lb 12.5 oz) IBW/kg (Calculated) : 68.4 HEPARIN  DW (KG): 76.6  Vital Signs: Temp: 98.6 F (37 C) (01/06 0429) Temp Source: Oral (01/06 0429) BP: 111/62 (01/06 0429) Pulse Rate: 76 (01/06 0429)  Labs: Recent Labs    06/17/24 0220 06/17/24 0606 06/17/24 1419 06/18/24 2115 06/19/24 0515  HGB 15.6  --   --  13.3 13.1  HCT 48.0  --   --  39.7 38.7*  PLT 53*  --   --  80* 105*  HEPARINUNFRC  --   --   --  0.63 0.50  CREATININE  --  1.22 1.25*  --  0.93    Estimated Creatinine Clearance: 82.7 mL/min (by C-G formula based on SCr of 0.93 mg/dL).   Medical History: Past Medical History:  Diagnosis Date   DM (diabetes mellitus) (HCC)     Medications:  Medications Prior to Admission  Medication Sig Dispense Refill Last Dose/Taking   LANTUS  SOLOSTAR 100 UNIT/ML Solostar Pen Inject 20 Units into the skin 2 (two) times daily.  5 06/15/2024 Bedtime   NOVOLIN N 100 UNIT/ML injection Inject 10 Units into the skin 2 (two) times daily. (Patient not taking: Reported on 06/16/2024)  5 Not Taking    Assessment: 79 yom admitted with fatigue, N/V/D and abdominal pain found to have AKI. PMH includes substance abuse, HTN, T2DM, neuropathy, GERD. D-dimer was elevated at >20. VQ scan reveals high probability PE in bilateral upper lobes. Pharmacy consulted to begin IV heparin . No AC PTA noted. SCr is trending down. No bleeding noted, Hgb is normal, platelets are low (251 03/2023). Patient has a history of medication noncompliance.   Pt now with PICC line placed by IV team. Heparin  running in peripheral line. Labs drawn from PICC.  Heparin  level therapeutic (0.50) on infusion at 1300 units/hr. No bleeding noted. H/H stable. Plt 105 (improving).   Goal of Therapy:  Heparin  level 0.3-0.7 units/ml Monitor  platelets by anticoagulation protocol: Yes   Plan:  Continue heparin  at 1300 units/hr Daily heparin  level and CBC  F/u long term AC plan   Thank you for involving pharmacy in this patient's care.  Rankin Sams, PharmD, BCPS, BCCCP Clinical Pharmacist  Addendum:  Pharmacy consulted to dose Eliquis   Will stop heparin  infusion Start Eliquis  10 mg BID x 7 days followed by 5 mg BID  Will do copay check and education before discharge   Rankin Sams, PharmD, BCCCP Clinical Pharmacist          [1] No Known Allergies

## 2024-06-19 NOTE — Progress Notes (Signed)
 " PROGRESS NOTE  Mike Small  FMW:[id number] DOB: 09/01/1965 DOA: 06/15/2024 PCP: Bari Morgans, NP   Brief Narrative: This 59 yrs old Male with medical history significant for hypertension, insulin -dependent type 2 diabetes, peripheral neuropathy, GERD, history of cocaine and heroin abuse presenting with a chief complaint of fatigue .  Also reported multiple episodes of vomiting, diarrhea, abdominal pain.  Report of abusing cocaine, fentanyl.  On presentation, sodium was 151, creatinine was 2.4.  Started on IV fluids with D5.  Nephrology consulted.  Chest x-ray showed bronchitis, no focal opacity.  VQ scan showed bilateral upper lobe PE.  Now on Eliquis .  PT consulted today.  Possible discharge home tomorrow.  Assessment & Plan:  Principal Problem:   Hyperglycemia Active Problems:   Cough   Dyspnea   Chest pain   AKI (acute kidney injury)   Acute hypoxemic respiratory failure (HCC)   Hyperglycemia: Insulin -dependent type 2 diabetes: Patient has poorly controlled diabetes with last hemoglobin A1c >15.5 in March 2024 in the setting of insulin  noncompliance.  Glucose close to 600 on initial labs.  DKA less likely as bicarb normal and only 5 ketones on UA.  Beta hydroxybutyric acid level 2.14. Continue Lantus  20 units daily.   Also on meal coverage. Carb modified diet .  Diabetic coordinator  consulted and following   Generalized abdominal pain, vomiting, non-bloody diarrhea: Lactic acidosis: No leukocytosis or signs of sepsis at presentation.  Lipase and LFTs normal.   UA not suggestive of infection.  COVID and flu negative.   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   C. difficile PCR and GI pathogen panel ordered: No stool sample obtained yet No abdominal pain, nausea or vomiting today.  Diarrhea resolved   Cough/dyspnea: SpO2 reportedly 80% on room air initially.  Subsequently weaned down to room air.  Resting comfortably, No  respiratory distress.  No leukocytosis on labs.   COVID / Influenza / RSV PCR negative.   Chest x-ray showing mild peribronchial thickening compatible with bronchitis and no focal pulmonary opacity.  D-dimer is elevated. CTA was not done because of AKI.  VQ scan showed high probability of bilateral upper lobe PE.  On Eliquis   Left-sided chest pain EKG without STEMI. Troponin 186, likely in the setting of above. No chest pain today   Acute kidney injury/hyperkalemia: Resolved  Hypernatremia: Resolved    Mild thrombocytopenia: No overt bleeding, monitor labs.   Hypocalcemia: QT prolongation: Replaced and Improved, Avoid QT prolonging drugs.   Tobacco abuse: NicoDerm patch and continue to counsel to quit.   Substance abuse: Per wife, patient is abusing cocaine and fentanyl.   UDS positive for cocaine, fentanyl  Generalized weakness: PT consulted      DVT prophylaxis: apixaban  (ELIQUIS ) tablet 10 mg  apixaban  (ELIQUIS ) tablet 5 mg     Code Status: Full Code  Family Communication: Wife at bedside on 1/6  Patient status: Inpatient  Patient is from : Home  Anticipated discharge to: Home  Estimated DC date: Tomorrow   Consultants: None  Procedures: None  Antimicrobials:  Anti-infectives (From admission, onward)    None       Subjective: Patient seen and examined at bedside today.  Hemodynamically stable.  Lying on bed.  On room air.  Denied any shortness of breath or cough.  Complains of generalized weakness.  Labs, vitals are stable.  We discussed about going home, patient does not feel ready to go home.  We requested PT assessment  Objective: Vitals:   06/19/24 0429 06/19/24 0730 06/19/24 1105 06/19/24 1110  BP: 111/62 110/71 (!) 131/113 109/80  Pulse: 76     Resp: 17 17 17    Temp: 98.6 F (37 C) 98.4 F (36.9 C)  97.7 F (36.5 C)  TempSrc: Oral Oral  Oral  SpO2:  98% 98%   Weight: 82 kg     Height:        Intake/Output Summary (Last 24  hours) at 06/19/2024 1235 Last data filed at 06/19/2024 0900 Gross per 24 hour  Intake 250 ml  Output --  Net 250 ml   Filed Weights   06/17/24 1607 06/18/24 0412 06/19/24 0429  Weight: 76.6 kg 76 kg 82 kg    Examination:  General exam: Overall comfortable, not in distress,lying on bed, appears weak HEENT: PERRL Respiratory system:  no wheezes or crackles  Cardiovascular system: S1 & S2 heard, RRR.  Gastrointestinal system: Abdomen is nondistended, soft and nontender. Central nervous system: Alert and oriented Extremities: No edema, no clubbing ,no cyanosis Skin: No rashes, no ulcers,no icterus     Data Reviewed: I have personally reviewed following labs and imaging studies  CBC: Recent Labs  Lab 06/16/24 0015 06/16/24 0026 06/16/24 0540 06/17/24 0220 06/18/24 2115 06/19/24 0515  WBC 10.4  --  9.8 11.9* 10.2 11.3*  NEUTROABS 8.0*  --   --   --   --   --   HGB 18.4* 17.3* 16.7 15.6 13.3 13.1  HCT 54.5* 51.0 49.8 48.0 39.7 38.7*  MCV 99.6  --  100.8* 103.0* 97.8 97.0  PLT 117*  --  91* 53* 80* 105*   Basic Metabolic Panel: Recent Labs  Lab 06/16/24 0843 06/17/24 0206 06/17/24 0606 06/17/24 1419 06/19/24 0515  NA 159* 161* 153* 151* 143  K 4.1 3.6 4.3 3.9 3.6  CL 119* 122* 117* 113* 109  CO2 27 28 23 25 26   GLUCOSE 339* 210* 293* 284* 335*  BUN 46* 29* 24* 21* 10  CREATININE 1.73* 1.39* 1.22 1.25* 0.93  CALCIUM  9.5 9.2 8.6* 8.8* 7.7*  MG  --  2.6*  --   --   --   PHOS  --  1.9*  --   --   --      Recent Results (from the past 240 hours)  Resp panel by RT-PCR (RSV, Flu A&B, Covid) Anterior Nasal Swab     Status: None   Collection Time: 06/15/24 11:50 PM   Specimen: Anterior Nasal Swab  Result Value Ref Range Status   SARS Coronavirus 2 by RT PCR NEGATIVE NEGATIVE Final   Influenza A by PCR NEGATIVE NEGATIVE Final   Influenza B by PCR NEGATIVE NEGATIVE Final    Comment: (NOTE) The Xpert Xpress SARS-CoV-2/FLU/RSV plus assay is intended as an aid in the  diagnosis of influenza from Nasopharyngeal swab specimens and should not be used as a sole basis for treatment. Nasal washings and aspirates are unacceptable for Xpert Xpress SARS-CoV-2/FLU/RSV testing.  Fact Sheet for Patients: bloggercourse.com  Fact Sheet for Healthcare Providers: seriousbroker.it  This test is not yet approved or cleared by the [place]  FDA and has been authorized for detection and/or diagnosis of SARS-CoV-2 by FDA under an Emergency Use Authorization (EUA). This EUA will remain in effect (meaning this test can be used) for the duration of the COVID-19 declaration under Section 564(b)(1) of the Act, 21 U.S.C. section 360bbb-3(b)(1), unless the authorization is terminated or revoked.     Resp Syncytial Virus  by PCR NEGATIVE NEGATIVE Final    Comment: (NOTE) Fact Sheet for Patients: bloggercourse.com  Fact Sheet for Healthcare Providers: seriousbroker.it  This test is not yet approved or cleared by the [place]  FDA and has been authorized for detection and/or diagnosis of SARS-CoV-2 by FDA under an Emergency Use Authorization (EUA). This EUA will remain in effect (meaning this test can be used) for the duration of the COVID-19 declaration under Section 564(b)(1) of the Act, 21 U.S.C. section 360bbb-3(b)(1), unless the authorization is terminated or revoked.  Performed at [facility], [address] [place], [place] [address]      Radiology Studies: US  EKG SITE RITE Result Date: 06/18/2024 If Site Rite image not attached, placement could not be confirmed due to current cardiac rhythm.  NM Pulmonary Perfusion Result Date: 06/18/2024 EXAM: NM Lung Perfusion Scan. CLINICAL HISTORY: Pulmonary embolism (PE) suspected, high prob. TECHNIQUE: Radiolabeled MAA was administered intravenously and planar images of the lungs were obtained in multiple  projections. RADIOPHARMACEUTICAL: 4.4 millicurie (technetium albumin  aggregated (MAA) injection solution 4.4 millicurie TECHNETIUM TO 4M ALBUMIN  AGGREGATED). COMPARISON: Chest radiograph of 06/18/2024. FINDINGS: PERFUSION: Large wedge shaped peripheral perfusion defect within the right upper lobe. No corresponding findings on comparison chest radiograph. Small perfusion defect within the apex of the left upper lobe which is also unmatched. Findings most consistent with bilateral upper lobes pulmonary emboli. High probability. IMPRESSION: 1. Findings most consistent with pulmonary emboli in the bilateral upper lobes, high probability. 2. These results will be called to the ordering clinician or representative by the radiologist assistant, and communication documented in the pacs or clario dashboard Electronically signed by: Norleen Boxer MD 06/18/2024 11:56 AM EST RP Workstation: [id number]   DG CHEST PORT 1 VIEW Result Date: 06/18/2024 CLINICAL DATA:  Hypoxia. EXAM: PORTABLE CHEST 1 VIEW COMPARISON:  06/15/2024 FINDINGS: The heart size and mediastinal contours are within normal limits. Low lung volumes noted. New mild bibasilar atelectasis. No evidence of focal consolidation or pleural effusion. IMPRESSION: Low lung volumes and mild bibasilar atelectasis. Electronically Signed   By: Norleen DELENA Kil M.D.   On: 06/18/2024 09:32    Scheduled Meds:  apixaban   10 mg Oral BID   Followed by   NOREEN ON 06/26/2024] apixaban   5 mg Oral BID   Chlorhexidine  Gluconate Cloth  6 each Topical Daily   insulin  aspart  0-15 Units Subcutaneous TID WC   insulin  aspart  4 Units Subcutaneous TID WC   insulin  glargine-yfgn  20 Units Subcutaneous Daily   nicotine   21 mg Transdermal Daily   sodium chloride  flush  10-40 mL Intracatheter Q12H   Continuous Infusions:     LOS: 2 days   Ivonne Mustache, MD [facility] P1/11/2024, 12:35 PM  "

## 2024-06-19 NOTE — Discharge Instructions (Addendum)
 Information on my medicine - ELIQUIS  (apixaban )   WHY WAS ELIQUIS  PRESCRIBED FOR YOU? Eliquis  was prescribed to treat blood clots that may have been found in the veins of your legs (deep vein thrombosis) or in your lungs (pulmonary embolism) and to reduce the risk of them occurring again.   WHAT DO YOU NEED TO KNOW ABOUT ELIQUIS  ? The starting dose is 10 mg (two 5 mg tablets) taken TWICE daily for the FIRST SEVEN (7) DAYS, then on  Tuesday 06/26/24  the dose is reduced to ONE 5 mg tablet taken TWICE daily.  Eliquis  may be taken with or without food.    Try to take the dose about the same time in the morning and in the evening. If you have difficulty swallowing the tablet whole please discuss with your pharmacist how to take the medication safely.   Take Eliquis  exactly as prescribed and DO NOT stop taking Eliquis  without talking to the doctor who prescribed the medication.  Stopping may increase your risk of developing a new blood clot.  Refill your prescription before you run out.   After discharge, you should have regular check-up appointments with your healthcare provider that is prescribing your Eliquis .    WHAT DO YOU DO IF YOU MISS A DOSE? If a dose of ELIQUIS  is not taken at the scheduled time, take it as soon as possible on the same day and twice-daily administration should be resumed. The dose should not be doubled to make up for a missed dose.   IMPORTANT SAFETY INFORMATION A possible side effect of Eliquis  is bleeding. You should call your healthcare provider right away if you experience any of the following: Bleeding from an injury or your nose that does not stop. Unusual colored urine (red or dark brown) or unusual colored stools (red or black). Unusual bruising for unknown reasons. A serious fall or if you hit your head (even if there is no bleeding).   Some medicines may interact with Eliquis  and might increase your risk of bleeding or clotting while on Eliquis . To  help avoid this, consult your healthcare provider or pharmacist prior to using any new prescription or non-prescription medications, including herbals, vitamins, non-steroidal anti-inflammatory drugs (NSAIDs) and supplements.   This website has more information on Eliquis  (apixaban ): [email or web address] .[email or web address] dena

## 2024-06-19 NOTE — Plan of Care (Signed)
   Problem: Education: Goal: Ability to describe self-care measures that may prevent or decrease complications (Diabetes Survival Skills Education) will improve Outcome: Progressing

## 2024-06-19 NOTE — Telephone Encounter (Signed)
 Pharmacy Patient Advocate Encounter  Insurance verification completed.    The patient is insured through [facility] MEDICAID.     Ran test claim for Eliquis  Starter Pack and the current 30 day co-pay is $4.   This test claim was processed through [employer]- copay amounts may vary at other pharmacies due to [employer], or as the patient moves through the different stages of their insurance plan.

## 2024-06-19 NOTE — Evaluation (Signed)
 Physical Therapy Evaluation Patient Details Name: Mike Small MRN: [id number] DOB: 11-06-1965 Today's Date: 06/19/2024  History of Present Illness  59 yo M adm 06/15/24 with fatigue, lactic acidosis, hypernatremia, hyperkalemia, hyperglycemia. 1/5 bil PE. PMHx: DM, HTN, neuropathy, fentanyl  and cocaine abuse  Clinical Impression  Pt admitted with above. PTA pt indep without AD living with spouse. Pt and spouse not forthcoming with living situation stating they have some situations going on they can't talk about.. Pt did not confirm or deny having a home to return to. Pt ambulating in room with decreased step height and length reporting feeling his LEs to be weaker than normal and unable to walk on them. Pt given RW to trial, pt reports I'll see tomorrow if I need it. Acute PT to cont to follow. Spoke with TOC team and RN regarding lack of patient's home information or if they have a home to go to.         If plan is discharge home, recommend the following: A little help with walking and/or transfers;A little help with bathing/dressing/bathroom   Can travel by private vehicle        Equipment Recommendations Rolling walker (2 wheels)  Recommendations for Other Services       Functional Status Assessment Patient has had a recent decline in their functional status and demonstrates the ability to make significant improvements in function in a reasonable and predictable amount of time.     Precautions / Restrictions Precautions Precautions: Fall Restrictions Weight Bearing Restrictions Per Provider Order: No      Mobility  Bed Mobility Overal bed mobility: Independent             General bed mobility comments: HOB elevated    Transfers Overall transfer level: Modified independent Equipment used: None               General transfer comment: pt stood up with wide base of support, increased time    Ambulation/Gait Ambulation/Gait assistance: Contact guard  assist Gait Distance (Feet): 30 Feet (in room) Assistive device: None, Rolling walker (2 wheels) Gait Pattern/deviations: Step-through pattern, Decreased stride length, Wide base of support Gait velocity: dec     General Gait Details: pt with slow stepping pattern reporting they feel weak, like they can't hold me up. Pt reaching for chair and sink. Pt given RW. Pt with improved step height and length however reports not sure when asked if he felt the RW helped.  Stairs            Wheelchair Mobility     Tilt Bed    Modified Rankin (Stroke Patients Only)       Balance Overall balance assessment: Mild deficits observed, not formally tested                                           Pertinent Vitals/Pain Pain Assessment Pain Assessment: No/denies pain    Home Living Family/patient expects to be discharged to:: Unsure                   Additional Comments: when asked who patient lives with pt stated we are going to have to figure that out. pt and spouse both not forthcoming with info stating We have some situations going on that we can't talk about but we will figure it out.  Pt did not give home  set up or confirm/deny if they have a home to go to    Prior Function Prior Level of Function : Independent/Modified Independent (doesn't work)             Mobility Comments: no AD ADLs Comments: indep     Extremity/Trunk Assessment   Upper Extremity Assessment Upper Extremity Assessment: Generalized weakness    Lower Extremity Assessment Lower Extremity Assessment: Generalized weakness    Cervical / Trunk Assessment Cervical / Trunk Assessment: Normal  Communication   Communication Communication: No apparent difficulties    Cognition Arousal: Alert Behavior During Therapy: Flat affect                           PT - Cognition Comments: pt not forthcoming with information Following commands: Intact        Cueing Cueing Techniques: Verbal cues     General Comments General comments (skin integrity, edema, etc.): L LE edema    Exercises     Assessment/Plan    PT Assessment Patient needs continued PT services  PT Problem List Decreased strength;Decreased range of motion;Decreased activity tolerance;Decreased balance;Decreased mobility       PT Treatment Interventions DME instruction;Gait training;Stair training;Functional mobility training;Therapeutic activities;Therapeutic exercise;Balance training;Neuromuscular re-education    PT Goals (Current goals can be found in the Care Plan section)  Acute Rehab PT Goals Patient Stated Goal: feel better PT Goal Formulation: With patient Time For Goal Achievement: 07/03/24 Potential to Achieve Goals: Good    Frequency Min 2X/week     Co-evaluation               AM-PAC PT 6 Clicks Mobility  Outcome Measure Help needed turning from your back to your side while in a flat bed without using bedrails?: None Help needed moving from lying on your back to sitting on the side of a flat bed without using bedrails?: None Help needed moving to and from a bed to a chair (including a wheelchair)?: None Help needed standing up from a chair using your arms (e.g., wheelchair or bedside chair)?: A Little Help needed to walk in hospital room?: A Little Help needed climbing 3-5 steps with a railing? : A Little 6 Click Score: 21    End of Session   Activity Tolerance: Patient limited by fatigue Patient left: in bed;with call bell/phone within reach;with family/visitor present (sitting EOB to eat lunch) Nurse Communication: Mobility status;Other (comment) (unsure of home situation) PT Visit Diagnosis: Unsteadiness on feet (R26.81);Muscle weakness (generalized) (M62.81)    Time: 1345-1410 PT Time Calculation (min) (ACUTE ONLY): 25 min   Charges:   PT Evaluation $PT Eval Low Complexity: 1 Low PT Treatments $Gait Training: 8-22 mins PT  General Charges $$ ACUTE PT VISIT: 1 Visit         Mike Small, PT, DPT Acute Rehabilitation Services Secure chat preferred Office #: [phone]   Mike Small 06/19/2024, 2:42 PM

## 2024-06-20 DIAGNOSIS — R739 Hyperglycemia, unspecified: Secondary | ICD-10-CM | POA: Diagnosis not present

## 2024-06-20 LAB — CBC
HCT: 38.8 % — ABNORMAL LOW (ref 39.0–52.0)
Hemoglobin: 13.1 g/dL (ref 13.0–17.0)
MCH: 33.1 pg (ref 26.0–34.0)
MCHC: 33.8 g/dL (ref 30.0–36.0)
MCV: 98 fL (ref 80.0–100.0)
Platelets: 148 K/uL — ABNORMAL LOW (ref 150–400)
RBC: 3.96 MIL/uL — ABNORMAL LOW (ref 4.22–5.81)
RDW: 12.4 % (ref 11.5–15.5)
WBC: 11.7 K/uL — ABNORMAL HIGH (ref 4.0–10.5)
nRBC: 0 % (ref 0.0–0.2)

## 2024-06-20 LAB — BASIC METABOLIC PANEL WITH GFR
Anion gap: 9 (ref 5–15)
BUN: 10 mg/dL (ref 6–20)
CO2: 25 mmol/L (ref 22–32)
Calcium: 8.1 mg/dL — ABNORMAL LOW (ref 8.9–10.3)
Chloride: 107 mmol/L (ref 98–111)
Creatinine, Ser: 0.89 mg/dL (ref 0.61–1.24)
GFR, Estimated: >60 mL/min
Glucose, Bld: 329 mg/dL — ABNORMAL HIGH (ref 70–99)
Potassium: 3.4 mmol/L — ABNORMAL LOW (ref 3.5–5.1)
Sodium: 140 mmol/L (ref 135–145)

## 2024-06-20 LAB — GLUCOSE, CAPILLARY
Glucose-Capillary: 165 mg/dL — ABNORMAL HIGH (ref 70–99)
Glucose-Capillary: 187 mg/dL — ABNORMAL HIGH (ref 70–99)
Glucose-Capillary: 205 mg/dL — ABNORMAL HIGH (ref 70–99)
Glucose-Capillary: 263 mg/dL — ABNORMAL HIGH (ref 70–99)
Glucose-Capillary: 265 mg/dL — ABNORMAL HIGH (ref 70–99)
Glucose-Capillary: 308 mg/dL — ABNORMAL HIGH (ref 70–99)
Glucose-Capillary: 361 mg/dL — ABNORMAL HIGH (ref 70–99)
Glucose-Capillary: 405 mg/dL — ABNORMAL HIGH (ref 70–99)

## 2024-06-20 MED ORDER — POTASSIUM CHLORIDE CRYS ER 20 MEQ PO TBCR
40.0000 meq | EXTENDED_RELEASE_TABLET | Freq: Once | ORAL | Status: AC
  Administered 2024-06-20: 40 meq via ORAL

## 2024-06-20 MED ORDER — INSULIN GLARGINE 100 UNIT/ML ~~LOC~~ SOLN
5.0000 [IU] | Freq: Once | SUBCUTANEOUS | Status: AC
  Administered 2024-06-20: 5 [IU] via SUBCUTANEOUS
  Filled 2024-06-20: qty 0.05

## 2024-06-20 MED ORDER — INSULIN GLARGINE-YFGN 100 UNIT/ML ~~LOC~~ SOLN
25.0000 [IU] | Freq: Every day | SUBCUTANEOUS | Status: DC
  Administered 2024-06-21: 25 [IU] via SUBCUTANEOUS
  Filled 2024-06-20: qty 0.25

## 2024-06-20 MED ORDER — INSULIN GLARGINE 100 UNIT/ML ~~LOC~~ SOLN: 5.0000 [IU] | Freq: Once | SUBCUTANEOUS | Status: DC

## 2024-06-20 NOTE — Progress Notes (Signed)
 " PROGRESS NOTE  Mike Small  FMW:[id number] DOB: 14-May-1966 DOA: 06/15/2024 PCP: Bari Morgans, NP   Brief Narrative: This 59 yrs old Male with medical history significant for hypertension, insulin -dependent type 2 diabetes, peripheral neuropathy, GERD, history of cocaine and heroin abuse presenting with a chief complaint of fatigue .  Also reported multiple episodes of vomiting, diarrhea, abdominal pain.  Report of abusing cocaine, fentanyl.  On presentation, sodium was 151, creatinine was 2.4.  Started on IV fluids with D5.  Nephrology consulted.  Chest x-ray showed bronchitis, no focal opacity.  VQ scan showed bilateral upper lobe PE.  Now on Eliquis .  PT consulted ,no follow up recommended.  Patient is medically stable for discharge but says he does not have anywhere to stay and says he can go to his sister's house on Friday.  Medically stable for discharge whenever possible.  TOC following  Assessment & Plan:  Principal Problem:   Hyperglycemia Active Problems:   Cough   Dyspnea   Chest pain   AKI (acute kidney injury)   Acute hypoxemic respiratory failure (HCC)   Hyperglycemia: Insulin -dependent type 2 diabetes: Patient has poorly controlled diabetes with last hemoglobin A1c >15.5 in March 2024 in the setting of insulin  noncompliance.  Glucose close to 600 on initial labs.  DKA less likely as bicarb normal and only 5 ketones on UA.  Beta hydroxybutyric acid level 2.14. Continue Lantus  25 units daily and meal coverage .   Carb modified diet .  Diabetic coordinator  consulted and following   Generalized abdominal pain, vomiting, non-bloody diarrhea: Lactic acidosis: No leukocytosis or signs of sepsis at presentation.  Lipase and LFTs normal.   UA not suggestive of infection.  COVID and flu negative.   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   C. difficile PCR and GI pathogen panel ordered: No stool sample obtained yet No abdominal  pain, nausea or vomiting today.  Diarrhea resolved   Cough/dyspnea: SpO2 reportedly 80% on room air initially.  Subsequently weaned down to room air.  Resting comfortably, No respiratory distress.  No leukocytosis on labs.   COVID / Influenza / RSV PCR negative.   Chest x-ray showing mild peribronchial thickening compatible with bronchitis and no focal pulmonary opacity.  D-dimer is elevated. CTA was not done because of AKI.  VQ scan showed high probability of bilateral upper lobe PE.  On Eliquis   Left-sided chest pain EKG without STEMI. Troponin 186, likely in the setting of above. No chest pain today   Acute kidney injury/hyperkalemia: Resolved  Hypernatremia: Resolved    Mild thrombocytopenia: No overt bleeding, monitor labs.   Hypocalcemia: QT prolongation: Replaced and Improved, Avoid QT prolonging drugs.   Tobacco abuse: NicoDerm patch and continue to counsel to quit.   Substance abuse: Per wife, patient is abusing cocaine and fentanyl.   UDS positive for cocaine, fentanyl  Generalized weakness: PT consulted, no follow-up recommended      DVT prophylaxis: apixaban  (ELIQUIS ) tablet 10 mg  apixaban  (ELIQUIS ) tablet 5 mg     Code Status: Full Code  Family Communication: Wife at bedside on 1/7  Patient status: Inpatient  Patient is from : Home  Anticipated discharge to: Home  Estimated DC date: Whenever possible.  Patient says he has a place to stay only on  Friday.  TOC following   Consultants: None  Procedures: None  Antimicrobials:  Anti-infectives (From admission, onward)    None       Subjective: Patient  seen and examined the bedside today.  Hyperglycemic this morning.  Remains very comfortable.  No active issues .  Medically stable for discharge.  Objective: Vitals:   06/20/24 0441 06/20/24 0442 06/20/24 0718 06/20/24 1208  BP: 112/73  117/70 125/74  Pulse: 73  76 65  Resp:   16 16  Temp: 97.8 F (36.6 C)  98.4 F (36.9 C) 98.2 F  (36.8 C)  TempSrc: Oral  Oral Oral  SpO2: 95%  95% 97%  Weight:  85.3 kg    Height:        Intake/Output Summary (Last 24 hours) at 06/20/2024 1248 Last data filed at 06/20/2024 0800 Gross per 24 hour  Intake 240 ml  Output --  Net 240 ml   Filed Weights   06/18/24 0412 06/19/24 0429 06/20/24 0442  Weight: 76 kg 82 kg 85.3 kg    Examination:   General exam: Overall comfortable, not in distress HEENT: PERRL Respiratory system:  no wheezes or crackles  Cardiovascular system: S1 & S2 heard, RRR.  Gastrointestinal system: Abdomen is nondistended, soft and nontender. Central nervous system: Alert and oriented Extremities: No edema, no clubbing ,no cyanosis Skin: No rashes, no ulcers,no icterus     Data Reviewed: I have personally reviewed following labs and imaging studies  CBC: Recent Labs  Lab 06/16/24 0015 06/16/24 0026 06/16/24 0540 06/17/24 0220 06/18/24 2115 06/19/24 0515 06/20/24 0332  WBC 10.4  --  9.8 11.9* 10.2 11.3* 11.7*  NEUTROABS 8.0*  --   --   --   --   --   --   HGB 18.4*   < > 16.7 15.6 13.3 13.1 13.1  HCT 54.5*   < > 49.8 48.0 39.7 38.7* 38.8*  MCV 99.6  --  100.8* 103.0* 97.8 97.0 98.0  PLT 117*  --  91* 53* 80* 105* 148*   < > = values in this interval not displayed.   Basic Metabolic Panel: Recent Labs  Lab 06/17/24 0206 06/17/24 0606 06/17/24 1419 06/19/24 0515 06/20/24 1036  NA 161* 153* 151* 143 140  K 3.6 4.3 3.9 3.6 3.4*  CL 122* 117* 113* 109 107  CO2 28 23 25 26 25   GLUCOSE 210* 293* 284* 335* 329*  BUN 29* 24* 21* 10 10  CREATININE 1.39* 1.22 1.25* 0.93 0.89  CALCIUM  9.2 8.6* 8.8* 7.7* 8.1*  MG 2.6*  --   --   --   --   PHOS 1.9*  --   --   --   --      Recent Results (from the past 240 hours)  Resp panel by RT-PCR (RSV, Flu A&B, Covid) Anterior Nasal Swab     Status: None   Collection Time: 06/15/24 11:50 PM   Specimen: Anterior Nasal Swab  Result Value Ref Range Status   SARS Coronavirus 2 by RT PCR NEGATIVE  NEGATIVE Final   Influenza A by PCR NEGATIVE NEGATIVE Final   Influenza B by PCR NEGATIVE NEGATIVE Final    Comment: (NOTE) The Xpert Xpress SARS-CoV-2/FLU/RSV plus assay is intended as an aid in the diagnosis of influenza from Nasopharyngeal swab specimens and should not be used as a sole basis for treatment. Nasal washings and aspirates are unacceptable for Xpert Xpress SARS-CoV-2/FLU/RSV testing.  Fact Sheet for Patients: bloggercourse.com  Fact Sheet for Healthcare Providers: seriousbroker.it  This test is not yet approved or cleared by the [place]  FDA and has been authorized for detection and/or diagnosis of SARS-CoV-2 by FDA under  an Emergency Use Authorization (EUA). This EUA will remain in effect (meaning this test can be used) for the duration of the COVID-19 declaration under Section 564(b)(1) of the Act, 21 U.S.C. section 360bbb-3(b)(1), unless the authorization is terminated or revoked.     Resp Syncytial Virus by PCR NEGATIVE NEGATIVE Final    Comment: (NOTE) Fact Sheet for Patients: bloggercourse.com  Fact Sheet for Healthcare Providers: seriousbroker.it  This test is not yet approved or cleared by the [place]  FDA and has been authorized for detection and/or diagnosis of SARS-CoV-2 by FDA under an Emergency Use Authorization (EUA). This EUA will remain in effect (meaning this test can be used) for the duration of the COVID-19 declaration under Section 564(b)(1) of the Act, 21 U.S.C. section 360bbb-3(b)(1), unless the authorization is terminated or revoked.  Performed at [facility], [address], [place], [place] [address]      Radiology Studies: No results found.   Scheduled Meds:  apixaban   10 mg Oral BID   Followed by   NOREEN ON 06/26/2024] apixaban   5 mg Oral BID   Chlorhexidine  Gluconate Cloth  6 each Topical Daily   insulin   aspart  0-15 Units Subcutaneous TID WC   insulin  aspart  4 Units Subcutaneous TID WC   insulin  glargine  5 Units Subcutaneous Once   [START ON 06/21/2024] insulin  glargine-yfgn  25 Units Subcutaneous Daily   nicotine   21 mg Transdermal Daily   sodium chloride  flush  10-40 mL Intracatheter Q12H   Continuous Infusions:     LOS: 3 days   Ivonne Mustache, MD [facility] P1/12/2024, 12:48 PM  "

## 2024-06-20 NOTE — Progress Notes (Signed)
 Patient will be staying with family at discharge.

## 2024-06-20 NOTE — TOC Initial Note (Signed)
 Transition of Care [facility]) - Initial/Assessment Note    Patient Details  Name: Mike Small MRN: [id number] Date of Birth: 1966-01-17  [facility] CM/SW Contact:    Roxie KANDICE Stain, RN Phone Number: 06/20/2024, 11:49 AM  Clinical Narrative:                 Spoke to wife in husband at bedside. Wife states they will have a place to stay on Friday this is the sister-in-law's home. Patient denies SDOH resources and substance abuse resources. Patient also declines we walker. PCP confirmed. ICM-inpatient care management will continue to follow.  Expected Discharge Plan: Home/Self Care Barriers to Discharge: Continued Medical Work up   Patient Goals and CMS Choice Patient states their goals for this hospitalization and ongoing recovery are:: discharge Friday to sister in law's home          Expected Discharge Plan and Services   Discharge Planning Services: CM Consult   Living arrangements for the past 2 months: Single Family Home                                      Prior Living Arrangements/Services Living arrangements for the past 2 months: Single Family Home Lives with:: Significant Other, Relatives Patient language and need for interpreter reviewed:: Yes Do you feel safe going back to the place where you live?: Yes      Need for Family Participation in Patient Care: Yes (Comment) Care giver support system in place?: Yes (comment)   Criminal Activity/Legal Involvement Pertinent to Current Situation/Hospitalization: No - Comment as needed  Activities of Daily Living   ADL Screening (condition at time of admission) Independently performs ADLs?: Yes (appropriate for developmental age) Is the patient deaf or have difficulty hearing?: No Does the patient have difficulty seeing, even when wearing glasses/contacts?: No Does the patient have difficulty concentrating, remembering, or making decisions?: No  Permission Sought/Granted                   Emotional Assessment Appearance:: Appears stated age Attitude/Demeanor/Rapport: Guarded Affect (typically observed): Flat Orientation: : Oriented to Self, Oriented to Place, Oriented to  Time, Oriented to Situation Alcohol / Substance Use: Not Applicable Psych Involvement: No (comment)  Admission diagnosis:  AKI (acute kidney injury) [N17.9] Acute hypoxemic respiratory failure (HCC) [J96.01] Hyperglycemia due to diabetes mellitus (HCC) [E11.65] Acute hypoxic respiratory failure (HCC) [J96.01] Patient Active Problem List   Diagnosis Date Noted   Acute hypoxemic respiratory failure (HCC) 06/17/2024   Hyperglycemia 06/16/2024   Cough 06/16/2024   Dyspnea 06/16/2024   Chest pain 06/16/2024   AKI (acute kidney injury) 06/16/2024   Heroin abuse (HCC) 08/26/2022   PNA (pneumonia) 08/26/2022   Vomiting 08/26/2022   DM (diabetes mellitus) (HCC) 08/26/2022   Tachycardia 08/26/2022   Tachypnea 08/26/2022   Rales 08/26/2022   CAP (community acquired pneumonia) 08/26/2022   PCP:  Horton, Lovika, NP Pharmacy:   [id number] [address] [place], [place] - [phone] AT [facility] & [address] [place] [address] Phone: [phone] Fax: [phone]  [facility] [address] [place] [address] Phone: [phone] Fax: [phone]     Social Drivers of Health (SDOH) Social History: SDOH Screenings   Food Insecurity: Food Insecurity Present (06/18/2024)  Housing: High Risk (06/18/2024)  Transportation Needs:  No Transportation Needs (06/18/2024)  Utilities: At Risk (06/18/2024)  Social Connections: Unknown (06/18/2024)  Tobacco Use: Medium Risk (06/15/2024)   SDOH Interventions:     Readmission Risk Interventions     No data to display

## 2024-06-20 NOTE — Plan of Care (Signed)
   Problem: Health Behavior/Discharge Planning: Goal: Ability to manage health-related needs will improve Outcome: Progressing

## 2024-06-20 NOTE — Inpatient Diabetes Management (Addendum)
 Inpatient Diabetes Program Recommendations  AACE/ADA: New Consensus Statement on Inpatient Glycemic Control   Target Ranges:  Prepandial:   less than 140 mg/dL      Peak postprandial:   less than 180 mg/dL (1-2 hours)      Critically ill patients:  140 - 180 mg/dL   Lab Results  Component Value Date   GLUCAP 405 (H) 06/20/2024   HGBA1C 14.2 (H) 06/16/2024    Latest Reference Range & Units 06/19/24 07:33 06/19/24 11:06 06/19/24 16:20 06/19/24 20:32 06/20/24 00:18 06/20/24 04:40 06/20/24 07:42  Glucose-Capillary 70 - 99 mg/dL 749 (H) 757 (H) 772 (H) 239 (H) 205 (H) 265 (H) 405 (H)   Review of Glycemic Control  Diabetes history: DM2  Outpatient Diabetes medications:  Lantus  20 units daily   Current orders for Inpatient glycemic control:  Novolog  0-15 units TID  Novolog  4 units TID with meals  Semglee  20 units daily  Inpatient Diabetes Program Recommendations:   Please consider increasing Semglee  to 25 units daily.   Addendum @ 1030: Spoke with patient and patient's wife at bedside to follow-up on diabetes education that was provided by our team earlier this week. Patient's wife denies any question or concerns concerning diabetes, A1C, or insulin  regimen.  Thanks, Lavanda Search, RN, MSN, [facility]  Inpatient Diabetes Coordinator  Pager [phone] (8a-5p)

## 2024-06-21 ENCOUNTER — Other Ambulatory Visit (HOSPITAL_COMMUNITY): Payer: Self-pay

## 2024-06-21 LAB — GLUCOSE, CAPILLARY
Glucose-Capillary: 220 mg/dL — ABNORMAL HIGH (ref 70–99)
Glucose-Capillary: 245 mg/dL — ABNORMAL HIGH (ref 70–99)
Glucose-Capillary: 245 mg/dL — ABNORMAL HIGH (ref 70–99)
Glucose-Capillary: 284 mg/dL — ABNORMAL HIGH (ref 70–99)
Glucose-Capillary: 335 mg/dL — ABNORMAL HIGH (ref 70–99)
Glucose-Capillary: 77 mg/dL (ref 70–99)

## 2024-06-21 MED ORDER — INSULIN GLARGINE-YFGN 100 UNIT/ML ~~LOC~~ SOLN
40.0000 [IU] | Freq: Every day | SUBCUTANEOUS | Status: DC
  Administered 2024-06-22: 40 [IU] via SUBCUTANEOUS
  Filled 2024-06-21: qty 0.4

## 2024-06-21 MED ORDER — LANTUS SOLOSTAR 100 UNIT/ML ~~LOC~~ SOPN
20.0000 [IU] | PEN_INJECTOR | Freq: Two times a day (BID) | SUBCUTANEOUS | 0 refills | Status: AC
  Filled 2024-06-21: qty 15, 38d supply, fill #0

## 2024-06-21 MED ORDER — NOVOLOG FLEXPEN 100 UNIT/ML ~~LOC~~ SOPN
5.0000 [IU] | PEN_INJECTOR | Freq: Three times a day (TID) | SUBCUTANEOUS | 0 refills | Status: AC
  Filled 2024-06-21: qty 9, 60d supply, fill #0

## 2024-06-21 MED ORDER — INSULIN GLARGINE 100 UNIT/ML ~~LOC~~ SOLN
15.0000 [IU] | Freq: Once | SUBCUTANEOUS | Status: AC
  Administered 2024-06-21: 15 [IU] via SUBCUTANEOUS
  Filled 2024-06-21: qty 0.15

## 2024-06-21 MED ORDER — POLYETHYLENE GLYCOL 3350 17 G PO PACK
17.0000 g | PACK | Freq: Every day | ORAL | Status: DC
  Administered 2024-06-21 – 2024-06-22 (×2): 17 g via ORAL
  Filled 2024-06-21 (×2): qty 1

## 2024-06-21 MED ORDER — SENNOSIDES-DOCUSATE SODIUM 8.6-50 MG PO TABS
1.0000 | ORAL_TABLET | Freq: Two times a day (BID) | ORAL | Status: DC
  Administered 2024-06-21 – 2024-06-22 (×2): 1 via ORAL
  Filled 2024-06-21 (×2): qty 1

## 2024-06-21 MED ORDER — BLOOD GLUCOSE TEST VI STRP
1.0000 | ORAL_STRIP | 0 refills | Status: AC
  Filled 2024-06-21: qty 100, 25d supply, fill #0

## 2024-06-21 MED ORDER — LANCET DEVICE MISC
1.0000 | 0 refills | Status: AC
  Filled 2024-06-21: qty 1, fill #0

## 2024-06-21 MED ORDER — APIXABAN 5 MG PO TABS
ORAL_TABLET | ORAL | 0 refills | Status: AC
  Filled 2024-06-21: qty 120, 55d supply, fill #0

## 2024-06-21 MED ORDER — NICOTINE 21 MG/24HR TD PT24
21.0000 mg | MEDICATED_PATCH | Freq: Every day | TRANSDERMAL | 0 refills | Status: AC
  Filled 2024-06-21: qty 28, 28d supply, fill #0

## 2024-06-21 MED ORDER — LANCETS MISC
1.0000 | 0 refills | Status: AC
  Filled 2024-06-21: qty 100, 25d supply, fill #0

## 2024-06-21 MED ORDER — PEN NEEDLES 32G X 4 MM MISC
1.0000 | Freq: Three times a day (TID) | 0 refills | Status: AC
  Filled 2024-06-21: qty 100, 33d supply, fill #0

## 2024-06-21 MED ORDER — BLOOD GLUCOSE MONITOR SYSTEM W/DEVICE KIT
1.0000 | PACK | 0 refills | Status: AC
  Filled 2024-06-21: qty 1, 30d supply, fill #0

## 2024-06-21 NOTE — Progress Notes (Signed)
 Physical Therapy Treatment Patient Details Name: Mike Small MRN: [id number] DOB: [birth year] Today's Date: 06/21/2024   History of Present Illness 59 yo M adm 06/15/24 with fatigue, lactic acidosis, hypernatremia, hyperkalemia, hyperglycemia. 1/5 bil PE. PMHx: DM, HTN, neuropathy, fentanyl  and cocaine abuse    PT Comments  Pt received in supine and agreeable to session. Pt demonstrates improved activity tolerance and stability this session, but continues to be limited by fatigue. Pt demonstrates some instability without UE support, but no overt LOB. Pt demonstrates some increased difficulty with single leg stance during standing marches requiring CGA for safety. Education on activity progression and seated LAQ and marches for improved strength and activity tolerance. Pt continues to benefit from PT services to progress toward functional mobility goals.     If plan is discharge home, recommend the following: A little help with walking and/or transfers;A little help with bathing/dressing/bathroom   Can travel by private vehicle        Equipment Recommendations  Rolling walker (2 wheels)    Recommendations for Other Services       Precautions / Restrictions Precautions Precautions: Fall Restrictions Weight Bearing Restrictions Per Provider Order: No     Mobility  Bed Mobility Overal bed mobility: Independent                  Transfers Overall transfer level: Modified independent Equipment used: None                    Ambulation/Gait Ambulation/Gait assistance: Contact guard assist Gait Distance (Feet): 100 Feet Assistive device: None Gait Pattern/deviations: Step-through pattern, Decreased stride length       General Gait Details: Pt demonstrates slow gait with some unsteadiness requiring CGA, but no overt LOB.   Stairs             Wheelchair Mobility     Tilt Bed    Modified Rankin (Stroke Patients Only)       Balance Overall balance  assessment: Needs assistance Sitting-balance support: No upper extremity supported, Feet supported Sitting balance-Leahy Scale: Good Sitting balance - Comments: EOB   Standing balance support: No upper extremity supported, During functional activity Standing balance-Leahy Scale: Fair Standing balance comment: some unsteadiness, but no overt LOB. Increased difficulty with static standing marches                            Communication Communication Communication: No apparent difficulties  Cognition Arousal: Alert Behavior During Therapy: Flat affect   PT - Cognitive impairments: No apparent impairments                         Following commands: Intact      Cueing Cueing Techniques: Verbal cues  Exercises General Exercises - Lower Extremity Hip Flexion/Marching: AROM, Standing, Both, 10 reps    General Comments        Pertinent Vitals/Pain Pain Assessment Pain Assessment: No/denies pain     PT Goals (current goals can now be found in the care plan section) Acute Rehab PT Goals Patient Stated Goal: feel better PT Goal Formulation: With patient Time For Goal Achievement: 07/03/24 Progress towards PT goals: Progressing toward goals    Frequency    Min 2X/week       AM-PAC PT 6 Clicks Mobility   Outcome Measure  Help needed turning from your back to your side while in a flat bed without using  bedrails?: None Help needed moving from lying on your back to sitting on the side of a flat bed without using bedrails?: None Help needed moving to and from a bed to a chair (including a wheelchair)?: None Help needed standing up from a chair using your arms (e.g., wheelchair or bedside chair)?: A Little Help needed to walk in hospital room?: A Little Help needed climbing 3-5 steps with a railing? : A Little 6 Click Score: 21    End of Session Equipment Utilized During Treatment: Gait belt Activity Tolerance: Patient limited by fatigue Patient  left: in bed;with call bell/phone within reach;with family/visitor present Nurse Communication: Mobility status PT Visit Diagnosis: Unsteadiness on feet (R26.81);Muscle weakness (generalized) (M62.81)     Time: 9093-9080 PT Time Calculation (min) (ACUTE ONLY): 13 min  Charges:    $Gait Training: 8-22 mins PT General Charges $$ ACUTE PT VISIT: 1 Visit                    Darryle George, PTA Acute Rehabilitation Services Secure Chat Preferred  Office:([phone]    Darryle George 06/21/2024, 9:28 AM

## 2024-06-21 NOTE — Progress Notes (Signed)
 " PROGRESS NOTE  Mike Small  FMW:[id number] DOB: 10/10/1965 DOA: 06/15/2024 PCP: Bari Morgans, NP   Brief Narrative: This 59 yrs old Male with medical history significant for hypertension, insulin -dependent type 2 diabetes, peripheral neuropathy, GERD, history of cocaine and heroin abuse presenting with a chief complaint of fatigue .  Also reported multiple episodes of vomiting, diarrhea, abdominal pain.  Report of abusing cocaine, fentanyl.  On presentation, sodium was 151, creatinine was 2.4.  Started on IV fluids with D5.  Nephrology consulted.  Chest x-ray showed bronchitis, no focal opacity.  VQ scan showed bilateral upper lobe PE.  Now on Eliquis .  PT consulted ,no follow up recommended.  Patient is medically stable for discharge but says he does not have anywhere to stay and says he can go to his sister's house on Friday.  Medically stable for discharge whenever possible.  TOC following  Assessment & Plan:  Principal Problem:   Hyperglycemia Active Problems:   Cough   Dyspnea   Chest pain   AKI (acute kidney injury)   Acute hypoxemic respiratory failure (HCC)   Hyperglycemia: Insulin -dependent type 2 diabetes: Patient has poorly controlled diabetes with last hemoglobin A1c >15.5 in March 2024 in the setting of insulin  noncompliance.  Glucose close to 600 on initial labs.  DKA less likely as bicarb normal and only 5 ketones on UA. Continue Lantus  25 units daily and meal coverage .   Carb modified diet .  Diabetic coordinator  was consulted and following   Generalized abdominal pain, vomiting, non-bloody diarrhea: Lactic acidosis: No leukocytosis or signs of sepsis at presentation.  Lipase and LFTs normal.   UA not suggestive of infection.  COVID and flu negative.   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   C. difficile PCR and GI pathogen panel ordered: No stool sample obtained yet No abdominal pain, nausea or vomiting today.   Diarrhea resolved   Cough/dyspnea: SpO2 reportedly 80% on room air initially.  Subsequently weaned down to room air.  Resting comfortably, No respiratory distress.  No leukocytosis on labs.   COVID / Influenza / RSV PCR negative.   Chest x-ray showing mild peribronchial thickening compatible with bronchitis and no focal pulmonary opacity.  D-dimer is elevated. CTA was not done because of AKI.  VQ scan showed high probability of bilateral upper lobe PE.  On Eliquis   Left-sided chest pain EKG without STEMI. Troponin 186, likely in the setting of above. No chest pain today   Acute kidney injury/hyperkalemia: Resolved  Hypernatremia: Resolved    Mild thrombocytopenia: No overt bleeding, monitor labs.   Hypocalcemia: QT prolongation: Replaced and Improved, Avoid QT prolonging drugs.   Tobacco abuse: NicoDerm patch and continue to counsel to quit.   Substance abuse: Per wife, patient is abusing cocaine and fentanyl.   UDS positive for cocaine, fentanyl  Generalized weakness: PT consulted, no follow-up recommended      DVT prophylaxis: apixaban  (ELIQUIS ) tablet 10 mg  apixaban  (ELIQUIS ) tablet 5 mg     Code Status: Full Code  Family Communication: Wife at bedside on 1/7  Patient status: Inpatient  Patient is from : Home  Anticipated discharge to: Home  Estimated DC date: Whenever possible.  Patient says he has a place to stay only on  Friday.  TOC following   Consultants: None  Procedures: None  Antimicrobials:  Anti-infectives (From admission, onward)    None       Subjective:  Patient seen and examined at  the bedside today.  No new issues.  Lying in bed.  Wife states they do not have any way to go today.  They are planning to go to patient's sister's house tomorrow  Objective: Vitals:   06/20/24 2031 06/21/24 0055 06/21/24 0424 06/21/24 0717  BP: (!) 143/96 125/77 134/77 (!) 141/85  Pulse: 65 80 70 74  Resp: 18 18 18 16   Temp: (!) 97.5 F (36.4  C) 98.4 F (36.9 C) 98.6 F (37 C) 98 F (36.7 C)  TempSrc: Oral Oral Oral Oral  SpO2: 93% 95% 94% 95%  Weight:      Height:       No intake or output data in the 24 hours ending 06/21/24 1336  Filed Weights   06/18/24 0412 06/19/24 0429 06/20/24 0442  Weight: 76 kg 82 kg 85.3 kg    Examination:   General exam: Overall comfortable, not in distress HEENT: PERRL Respiratory system:  no wheezes or crackles  Cardiovascular system: S1 & S2 heard, RRR.  Gastrointestinal system: Abdomen is nondistended, soft and nontender. Central nervous system: Alert and oriented Extremities: No edema, no clubbing ,no cyanosis Skin: No rashes, no ulcers,no icterus     Data Reviewed: I have personally reviewed following labs and imaging studies  CBC: Recent Labs  Lab 06/16/24 0015 06/16/24 0026 06/16/24 0540 06/17/24 0220 06/18/24 2115 06/19/24 0515 06/20/24 0332  WBC 10.4  --  9.8 11.9* 10.2 11.3* 11.7*  NEUTROABS 8.0*  --   --   --   --   --   --   HGB 18.4*   < > 16.7 15.6 13.3 13.1 13.1  HCT 54.5*   < > 49.8 48.0 39.7 38.7* 38.8*  MCV 99.6  --  100.8* 103.0* 97.8 97.0 98.0  PLT 117*  --  91* 53* 80* 105* 148*   < > = values in this interval not displayed.   Basic Metabolic Panel: Recent Labs  Lab 06/17/24 0206 06/17/24 0606 06/17/24 1419 06/19/24 0515 06/20/24 1036  NA 161* 153* 151* 143 140  K 3.6 4.3 3.9 3.6 3.4*  CL 122* 117* 113* 109 107  CO2 28 23 25 26 25   GLUCOSE 210* 293* 284* 335* 329*  BUN 29* 24* 21* 10 10  CREATININE 1.39* 1.22 1.25* 0.93 0.89  CALCIUM  9.2 8.6* 8.8* 7.7* 8.1*  MG 2.6*  --   --   --   --   PHOS 1.9*  --   --   --   --      Recent Results (from the past 240 hours)  Resp panel by RT-PCR (RSV, Flu A&B, Covid) Anterior Nasal Swab     Status: None   Collection Time: 06/15/24 11:50 PM   Specimen: Anterior Nasal Swab  Result Value Ref Range Status   SARS Coronavirus 2 by RT PCR NEGATIVE NEGATIVE Final   Influenza A by PCR NEGATIVE  NEGATIVE Final   Influenza B by PCR NEGATIVE NEGATIVE Final    Comment: (NOTE) The Xpert Xpress SARS-CoV-2/FLU/RSV plus assay is intended as an aid in the diagnosis of influenza from Nasopharyngeal swab specimens and should not be used as a sole basis for treatment. Nasal washings and aspirates are unacceptable for Xpert Xpress SARS-CoV-2/FLU/RSV testing.  Fact Sheet for Patients: bloggercourse.com  Fact Sheet for Healthcare Providers: seriousbroker.it  This test is not yet approved or cleared by the [place]  FDA and has been authorized for detection and/or diagnosis of SARS-CoV-2 by FDA under an Emergency Use Authorization (  EUA). This EUA will remain in effect (meaning this test can be used) for the duration of the COVID-19 declaration under Section 564(b)(1) of the Act, 21 U.S.C. section 360bbb-3(b)(1), unless the authorization is terminated or revoked.     Resp Syncytial Virus by PCR NEGATIVE NEGATIVE Final    Comment: (NOTE) Fact Sheet for Patients: bloggercourse.com  Fact Sheet for Healthcare Providers: seriousbroker.it  This test is not yet approved or cleared by the [place]  FDA and has been authorized for detection and/or diagnosis of SARS-CoV-2 by FDA under an Emergency Use Authorization (EUA). This EUA will remain in effect (meaning this test can be used) for the duration of the COVID-19 declaration under Section 564(b)(1) of the Act, 21 U.S.C. section 360bbb-3(b)(1), unless the authorization is terminated or revoked.  Performed at [facility], [address] [place], [place] [address]      Radiology Studies: No results found.   Scheduled Meds:  apixaban   10 mg Oral BID   Followed by   NOREEN ON 06/26/2024] apixaban   5 mg Oral BID   Chlorhexidine  Gluconate Cloth  6 each Topical Daily   insulin  aspart  0-15 Units Subcutaneous TID WC    insulin  aspart  4 Units Subcutaneous TID WC   insulin  glargine-yfgn  25 Units Subcutaneous Daily   nicotine   21 mg Transdermal Daily   sodium chloride  flush  10-40 mL Intracatheter Q12H   Continuous Infusions:     LOS: 4 days   Ivonne Mustache, MD [facility] P1/01/2025, 1:36 PM  "

## 2024-06-21 NOTE — Plan of Care (Signed)
" °  Problem: Education: Goal: Ability to describe self-care measures that may prevent or decrease complications (Diabetes Survival Skills Education) will improve Outcome: Progressing Goal: Individualized Educational Video(s) Outcome: Progressing   Problem: Coping: Goal: Ability to adjust to condition or change in health will improve Outcome: Progressing   Problem: Health Behavior/Discharge Planning: Goal: Ability to identify and utilize available resources and services will improve Outcome: Progressing Goal: Ability to manage health-related needs will improve Outcome: Progressing   Problem: Metabolic: Goal: Ability to maintain appropriate glucose levels will improve Outcome: Progressing   Problem: Nutritional: Goal: Maintenance of adequate nutrition will improve Outcome: Progressing Goal: Progress toward achieving an optimal weight will improve Outcome: Progressing   Problem: Clinical Measurements: Goal: Ability to maintain clinical measurements within normal limits will improve Outcome: Progressing Goal: Will remain free from infection Outcome: Progressing Goal: Diagnostic test results will improve Outcome: Progressing Goal: Respiratory complications will improve Outcome: Progressing Goal: Cardiovascular complication will be avoided Outcome: Progressing   Problem: Coping: Goal: Level of anxiety will decrease Outcome: Progressing   Problem: Pain Managment: Goal: General experience of comfort will improve and/or be controlled Outcome: Progressing   Problem: Safety: Goal: Ability to remain free from injury will improve Outcome: Progressing   "

## 2024-06-21 NOTE — Discharge Summary (Addendum)
 Physician Discharge Summary  CURREN MOHRMANN FMW:[id number] DOB: 1965-09-10 DOA: 06/15/2024  PCP: Bari Morgans, NP  Admit date: 06/15/2024 Discharge date: 06/22/2024  Admitted From: Home Disposition:  Home  Discharge Condition:Stable CODE STATUS:FULL Diet recommendation: Carb Modified  Brief/Interim Summary: Patient is a 59 yrs old Male with medical history significant for hypertension, insulin -dependent type 2 diabetes, peripheral neuropathy, GERD, history of cocaine and heroin abuse presenting with a chief complaint of fatigue .  Also reported multiple episodes of vomiting, diarrhea, abdominal pain.  Report of abusing cocaine, fentanyl.  On presentation, sodium was 151, creatinine was 2.4.  Started on IV fluids with D5.  Nephrology consulted.  Chest x-ray showed bronchitis, no focal opacity.  VQ scan showed bilateral upper lobe PE.  Now on Eliquis .  PT consulted ,no follow up recommended.  Patient is medically stable for discharge   Following problems were addressed during the hospitalization:  Hyperglycemia: Insulin -dependent type 2 diabetes: Patient has poorly controlled diabetes with last hemoglobin A1c >15.5 in March 2024 in the setting of insulin  noncompliance.  Glucose close to 600 on initial labs.  DKA less likely as bicarb normal and only 5 ketones on UA. Continue Lantus  20 units bid and novolog  5 units TID   meal coverage .   Needs close follow-up with PCP for management of his diabetes.   Generalized abdominal pain, vomiting, non-bloody diarrhea: Lactic acidosis: No leukocytosis or signs of sepsis at presentation.  Lipase and LFTs normal.   UA not suggestive of infection.  COVID and flu negative.   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   C. difficile PCR and GI pathogen panel ordered: No stool sample obtained yet No abdominal pain, nausea or vomiting today.  Diarrhea resolved   Cough/dyspnea: SpO2 reportedly 80% on room air  initially.  Subsequently weaned down to room air.  Resting comfortably, No respiratory distress.  No leukocytosis on labs.   COVID / Influenza / RSV PCR negative.   Chest x-ray showing mild peribronchial thickening compatible with bronchitis and no focal pulmonary opacity.  D-dimer is elevated. CTA was not done because of AKI.  VQ scan showed high probability of bilateral upper lobe PE.  On Eliquis    Left-sided chest pain EKG without STEMI. Troponin 186, likely in the setting of above. No chest pain today   Acute kidney injury/hyperkalemia: Resolved   Hypernatremia: Resolved    Mild thrombocytopenia: No overt bleeding, monitor labs.   Hypocalcemia: QT prolongation: Replaced and Improved, Avoid QT prolonging drugs.   Tobacco abuse: NicoDerm patch and continue to counsel to quit.   Substance abuse: Per wife, patient is abusing cocaine and fentanyl.   UDS positive for cocaine, fentanyl.  Counseled cessation   Generalized weakness: PT consulted, no follow-up recommended   Discharge Diagnoses:  Principal Problem:   Hyperglycemia Active Problems:   Cough   Dyspnea   Chest pain   AKI (acute kidney injury)   Acute hypoxemic respiratory failure [facility])    Discharge Instructions  Discharge Instructions     Diet Carb Modified   Complete by: As directed    Discharge instructions   Complete by: As directed    1)Please take your medications as instructed 2)Follow up with your PCP in a week 3)Monitor your blood sugars at home 4)Quit drugs   Increase activity slowly   Complete by: As directed       Allergies as of 06/22/2024   No Known Allergies      Medication List  STOP taking these medications    NovoLIN N 100 UNIT/ML injection Generic drug: insulin  NPH Human       TAKE these medications    apixaban  5 MG Tabs tablet Commonly known as: ELIQUIS  Take 2 tablets (10 mg total) by mouth 2 (two) times daily for 5 days, THEN 1 tablet (5 mg total) 2 (two)  times daily. Start taking on: June 22, 2024   Blood Glucose Monitor System w/Device Kit 1 each by Does not apply route as directed. Dispense based on patient and insurance preference. Use up to four times daily as directed. (FOR ICD-10 E10.9, E11.9).   BLOOD GLUCOSE TEST STRIPS Strp 1 each by Does not apply route as directed. Dispense based on patient and insurance preference. Use up to four times daily as directed. (FOR ICD-10 E10.9, E11.9).   Lancet Device Misc 1 each by Does not apply route as directed. Dispense based on patient and insurance preference. Use up to four times daily as directed. (FOR ICD-10 E10.9, E11.9).   Lancets Misc 1 each by Does not apply route as directed. Dispense based on patient and insurance preference. Use up to four times daily as directed. (FOR ICD-10 E10.9, E11.9).   Lantus  SoloStar 100 UNIT/ML Solostar Pen Generic drug: insulin  glargine Inject 20 Units into the skin 2 (two) times daily.   nicotine  21 mg/24hr patch Commonly known as: NICODERM CQ  - dosed in mg/24 hours Place 1 patch (21 mg total) onto the skin daily.   NovoLOG  FlexPen 100 UNIT/ML FlexPen Generic drug: insulin  aspart Inject 5 Units into the skin 3 (three) times daily with meals.   Pen Needles 32G X 4 MM Misc Use 3 (three) times daily.               Durable Medical Equipment  (From admission, onward)           Start     Ordered   06/19/24 1420  For home use only DME Walker rolling  Once       Question Answer Comment  Walker: With 5 Inch Wheels   Patient needs a walker to treat with the following condition Weakness      06/19/24 1420            Follow-up Information     Horton, Lovika, NP. Schedule an appointment as soon as possible for a visit in 1 week(s).   Contact information: [address] [place] [address] [phone]                Allergies[1]  Consultations: None   Procedures/Studies: US  EKG SITE RITE Result  Date: 06/18/2024 If Site Rite image not attached, placement could not be confirmed due to current cardiac rhythm.  NM Pulmonary Perfusion Result Date: 06/18/2024 EXAM: NM Lung Perfusion Scan. CLINICAL HISTORY: Pulmonary embolism (PE) suspected, high prob. TECHNIQUE: Radiolabeled MAA was administered intravenously and planar images of the lungs were obtained in multiple projections. RADIOPHARMACEUTICAL: 4.4 millicurie (technetium albumin  aggregated (MAA) injection solution 4.4 millicurie TECHNETIUM TO 36M ALBUMIN  AGGREGATED). COMPARISON: Chest radiograph of 06/18/2024. FINDINGS: PERFUSION: Large wedge shaped peripheral perfusion defect within the right upper lobe. No corresponding findings on comparison chest radiograph. Small perfusion defect within the apex of the left upper lobe which is also unmatched. Findings most consistent with bilateral upper lobes pulmonary emboli. High probability. IMPRESSION: 1. Findings most consistent with pulmonary emboli in the bilateral upper lobes, high probability. 2. These results will be called to the ordering clinician or representative  by the radiologist assistant, and communication documented in the pacs or clario dashboard Electronically signed by: Norleen Boxer MD 06/18/2024 11:56 AM EST RP Workstation: [id number]   DG CHEST PORT 1 VIEW Result Date: 06/18/2024 CLINICAL DATA:  Hypoxia. EXAM: PORTABLE CHEST 1 VIEW COMPARISON:  06/15/2024 FINDINGS: The heart size and mediastinal contours are within normal limits. Low lung volumes noted. New mild bibasilar atelectasis. No evidence of focal consolidation or pleural effusion. IMPRESSION: Low lung volumes and mild bibasilar atelectasis. Electronically Signed   By: Norleen DELENA Kil M.D.   On: 06/18/2024 09:32   CT ABDOMEN PELVIS WO CONTRAST Result Date: 06/16/2024 EXAM: CT ABDOMEN AND PELVIS WITHOUT CONTRAST 06/16/2024 05:42:00 AM TECHNIQUE: CT of the abdomen and pelvis was performed without the administration of intravenous  contrast. Multiplanar reformatted images are provided for review. Automated exposure control, iterative reconstruction, and/or weight-based adjustment of the mA/kV was utilized to reduce the radiation dose to as low as reasonably achievable. COMPARISON: None available. CLINICAL HISTORY: Abdominal pain, acute, nonlocalized. FINDINGS: LIMITATIONS/ARTIFACTS: The exam is degraded by motion artifact. LOWER CHEST: Pleural parenchymal scarring versus subpleural subsegmental atelectasis noted within the periphery of the lung bases. LIVER: The liver is unremarkable. GALLBLADDER AND BILE DUCTS: Gallbladder is unremarkable. No biliary ductal dilatation. SPLEEN: No acute abnormality. PANCREAS: No acute abnormality. ADRENAL GLANDS: No acute abnormality. KIDNEYS, URETERS AND BLADDER: No stones in the kidneys or ureters. No hydronephrosis. No perinephric or periureteral stranding. Urinary bladder is unremarkable. GI AND BOWEL: Stomach demonstrates no acute abnormality. The appendix is visualized and appears normal. Scattered colonic diverticula identified without signs of acute diverticulitis. There is no bowel obstruction. PERITONEUM AND RETROPERITONEUM: No ascites. No free air. No free fluid or fluid collections identified within the abdomen or pelvis. VASCULATURE: Aorta is normal in caliber. Aortic atherosclerotic calcifications. LYMPH NODES: No lymphadenopathy. REPRODUCTIVE ORGANS: No acute abnormality. BONES AND SOFT TISSUES: Increased sclerosis within the subchondral humeral head suggests early avascular necrosis bilaterally. No acute osseous abnormality. No focal soft tissue abnormality. IMPRESSION: 1. No acute findings in the abdomen or pelvis. 2. 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 Electronically signed by: Waddell Calk MD 06/16/2024 06:00 AM EST RP Workstation: [id number]   DG Chest 2 View Result Date: 06/15/2024 EXAM: 2 VIEW(S) XRAY OF THE CHEST 06/15/2024  11:02:00 PM COMPARISON: 08/26/2022. CLINICAL HISTORY: SOB SOB. FINDINGS: LUNGS AND PLEURA: Mild peribronchial thickening compatible with bronchitis. No focal pulmonary opacity. No pleural effusion. No pneumothorax. HEART AND MEDIASTINUM: No acute abnormality of the cardiac and mediastinal silhouettes. BONES AND SOFT TISSUES: No acute osseous abnormality. IMPRESSION: 1. Mild peribronchial thickening compatible with bronchitis. Electronically signed by: Franky Crease MD 06/15/2024 11:37 PM EST RP Workstation: [id number]      Subjective: Patient seen and examined at bedside today.  Hemodynamically stable.  No new complaints today.  Medically stable for discharge home today.  He is planning to go to his sister's house.  No new change in the medical management.  Again discussed about the importance of taking insulin , quitting drugs and close follow-up with PCP.  Discharge Exam: Vitals:   06/22/24 0436 06/22/24 0813  BP: 136/75 123/71  Pulse: 74 77  Resp: 16 16  Temp: 98.1 F (36.7 C) 97.8 F (36.6 C)  SpO2: 95% 93%   Vitals:   06/21/24 1619 06/21/24 2021 06/22/24 0436 06/22/24 0813  BP: 132/83 137/82 136/75 123/71  Pulse: 72 71 74 77  Resp: 17 17 16 16   Temp: 98.4 F (36.9 C) 98  F (36.7 C) 98.1 F (36.7 C) 97.8 F (36.6 C)  TempSrc: Oral Oral Oral   SpO2: 95% 97% 95% 93%  Weight:      Height:        General: Pt is alert, awake, not in acute distress Cardiovascular: RRR, S1/S2 +, no rubs, no gallops Respiratory: CTA bilaterally, no wheezing, no rhonchi Abdominal: Soft, NT, ND, bowel sounds + Extremities: no edema, no cyanosis    The results of significant diagnostics from this hospitalization (including imaging, microbiology, ancillary and laboratory) are listed below for reference.     Microbiology: Recent Results (from the past 240 hours)  Resp panel by RT-PCR (RSV, Flu A&B, Covid) Anterior Nasal Swab     Status: None   Collection Time: 06/15/24 11:50 PM   Specimen:  Anterior Nasal Swab  Result Value Ref Range Status   SARS Coronavirus 2 by RT PCR NEGATIVE NEGATIVE Final   Influenza A by PCR NEGATIVE NEGATIVE Final   Influenza B by PCR NEGATIVE NEGATIVE Final    Comment: (NOTE) The Xpert Xpress SARS-CoV-2/FLU/RSV plus assay is intended as an aid in the diagnosis of influenza from Nasopharyngeal swab specimens and should not be used as a sole basis for treatment. Nasal washings and aspirates are unacceptable for Xpert Xpress SARS-CoV-2/FLU/RSV testing.  Fact Sheet for Patients: bloggercourse.com  Fact Sheet for Healthcare Providers: seriousbroker.it  This test is not yet approved or cleared by the [place]  FDA and has been authorized for detection and/or diagnosis of SARS-CoV-2 by FDA under an Emergency Use Authorization (EUA). This EUA will remain in effect (meaning this test can be used) for the duration of the COVID-19 declaration under Section 564(b)(1) of the Act, 21 U.S.C. section 360bbb-3(b)(1), unless the authorization is terminated or revoked.     Resp Syncytial Virus by PCR NEGATIVE NEGATIVE Final    Comment: (NOTE) Fact Sheet for Patients: bloggercourse.com  Fact Sheet for Healthcare Providers: seriousbroker.it  This test is not yet approved or cleared by the [place]  FDA and has been authorized for detection and/or diagnosis of SARS-CoV-2 by FDA under an Emergency Use Authorization (EUA). This EUA will remain in effect (meaning this test can be used) for the duration of the COVID-19 declaration under Section 564(b)(1) of the Act, 21 U.S.C. section 360bbb-3(b)(1), unless the authorization is terminated or revoked.  Performed at [facility], [address] [place], [place] [address]      Labs: BNP (last 3 results) No results for input(s): BNP in the last 8760 hours. Basic Metabolic Panel: Recent Labs   Lab 06/17/24 0206 06/17/24 0606 06/17/24 1419 06/19/24 0515 06/20/24 1036 06/22/24 0055  NA 161* 153* 151* 143 140 138  K 3.6 4.3 3.9 3.6 3.4* 3.5  CL 122* 117* 113* 109 107 104  CO2 28 23 25 26 25 28   GLUCOSE 210* 293* 284* 335* 329* 257*  BUN 29* 24* 21* 10 10 <5*  CREATININE 1.39* 1.22 1.25* 0.93 0.89 0.70  CALCIUM  9.2 8.6* 8.8* 7.7* 8.1* 8.1*  MG 2.6*  --   --   --   --   --   PHOS 1.9*  --   --   --   --   --    Liver Function Tests: Recent Labs  Lab 06/16/24 0015  AST 24  ALT 18  ALKPHOS 89  BILITOT 0.7  PROT 8.0  ALBUMIN  3.9   Recent Labs  Lab 06/16/24 0015  LIPASE 50   No results for input(s):  AMMONIA in the last 168 hours. CBC: Recent Labs  Lab 06/16/24 0015 06/16/24 0026 06/17/24 0220 06/18/24 2115 06/19/24 0515 06/20/24 0332 06/22/24 0055  WBC 10.4   < > 11.9* 10.2 11.3* 11.7* 11.2*  NEUTROABS 8.0*  --   --   --   --   --   --   HGB 18.4*   < > 15.6 13.3 13.1 13.1 12.5*  HCT 54.5*   < > 48.0 39.7 38.7* 38.8* 37.0*  MCV 99.6   < > 103.0* 97.8 97.0 98.0 96.9  PLT 117*   < > 53* 80* 105* 148* 249   < > = values in this interval not displayed.   Cardiac Enzymes: No results for input(s): CKTOTAL, CKMB, CKMBINDEX, TROPONINI in the last 168 hours. BNP: Invalid input(s): POCBNP CBG: Recent Labs  Lab 06/21/24 1621 06/21/24 2023 06/21/24 2355 06/22/24 0438 06/22/24 0813  GLUCAP 77 245* 220* 196* 250*   D-Dimer No results for input(s): DDIMER in the last 72 hours. Hgb A1c No results for input(s): HGBA1C in the last 72 hours. Lipid Profile No results for input(s): CHOL, HDL, LDLCALC, TRIG, CHOLHDL, LDLDIRECT in the last 72 hours. Thyroid function studies No results for input(s): TSH, T4TOTAL, T3FREE, THYROIDAB in the last 72 hours.  Invalid input(s): FREET3 Anemia work up No results for input(s): VITAMINB12, FOLATE, FERRITIN, TIBC, IRON, RETICCTPCT in the last 72 hours. Urinalysis     Component Value Date/Time   COLORURINE YELLOW 06/16/2024 0248   APPEARANCEUR HAZY (A) 06/16/2024 0248   LABSPEC 1.025 06/16/2024 0248   PHURINE 5.0 06/16/2024 0248   GLUCOSEU >=500 (A) 06/16/2024 0248   HGBUR SMALL (A) 06/16/2024 0248   BILIRUBINUR NEGATIVE 06/16/2024 0248   KETONESUR 5 (A) 06/16/2024 0248   PROTEINUR 30 (A) 06/16/2024 0248   UROBILINOGEN 0.2 11/01/2013 1131   NITRITE NEGATIVE 06/16/2024 0248   LEUKOCYTESUR NEGATIVE 06/16/2024 0248   Sepsis Labs Recent Labs  Lab 06/18/24 2115 06/19/24 0515 06/20/24 0332 06/22/24 0055  WBC 10.2 11.3* 11.7* 11.2*   Microbiology Recent Results (from the past 240 hours)  Resp panel by RT-PCR (RSV, Flu A&B, Covid) Anterior Nasal Swab     Status: None   Collection Time: 06/15/24 11:50 PM   Specimen: Anterior Nasal Swab  Result Value Ref Range Status   SARS Coronavirus 2 by RT PCR NEGATIVE NEGATIVE Final   Influenza A by PCR NEGATIVE NEGATIVE Final   Influenza B by PCR NEGATIVE NEGATIVE Final    Comment: (NOTE) The Xpert Xpress SARS-CoV-2/FLU/RSV plus assay is intended as an aid in the diagnosis of influenza from Nasopharyngeal swab specimens and should not be used as a sole basis for treatment. Nasal washings and aspirates are unacceptable for Xpert Xpress SARS-CoV-2/FLU/RSV testing.  Fact Sheet for Patients: bloggercourse.com  Fact Sheet for Healthcare Providers: seriousbroker.it  This test is not yet approved or cleared by the [place]  FDA and has been authorized for detection and/or diagnosis of SARS-CoV-2 by FDA under an Emergency Use Authorization (EUA). This EUA will remain in effect (meaning this test can be used) for the duration of the COVID-19 declaration under Section 564(b)(1) of the Act, 21 U.S.C. section 360bbb-3(b)(1), unless the authorization is terminated or revoked.     Resp Syncytial Virus by PCR NEGATIVE NEGATIVE Final    Comment: (NOTE) Fact  Sheet for Patients: bloggercourse.com  Fact Sheet for Healthcare Providers: seriousbroker.it  This test is not yet approved or cleared by the [place]  FDA and has been authorized for  detection and/or diagnosis of SARS-CoV-2 by FDA under an Emergency Use Authorization (EUA). This EUA will remain in effect (meaning this test can be used) for the duration of the COVID-19 declaration under Section 564(b)(1) of the Act, 21 U.S.C. section 360bbb-3(b)(1), unless the authorization is terminated or revoked.  Performed at [facility], [address], [place], [place] [address]     Please note: You were cared for by a hospitalist during your hospital stay. Once you are discharged, your primary care physician will handle any further medical issues. Please note that NO REFILLS for any discharge medications will be authorized once you are discharged, as it is imperative that you return to your primary care physician (or establish a relationship with a primary care physician if you do not have one) for your post hospital discharge needs so that they can reassess your need for medications and monitor your lab values.    Time coordinating discharge: 40 minutes  SIGNED:   Ivonne Mustache, MD  [facility] 06/22/2024, 10:39 AM Pager [phone]  If 7PM-7AM, please contact night-coverage [email or web address] [facility] Physician Discharge Summary  COLT MARTELLE FMW:[id number] DOB: 07/09/1965 DOA: 06/15/2024  PCP: Bari Morgans, NP  Admit date: 06/15/2024 Discharge date: 06/22/2024  Admitted From: Home Disposition:  Home  Discharge Condition:Stable CODE STATUS:FULL, DNR, Comfort Care Diet recommendation: Heart Healthy / Carb Modified / Regular / Dysphagia   Brief/Interim Summary:   Following problems were addressed during the hospitalization:   Discharge Diagnoses:  Principal Problem:   Hyperglycemia Active Problems:    Cough   Dyspnea   Chest pain   AKI (acute kidney injury)   Acute hypoxemic respiratory failure [facility])    Discharge Instructions  Discharge Instructions     Diet Carb Modified   Complete by: As directed    Discharge instructions   Complete by: As directed    1)Please take your medications as instructed 2)Follow up with your PCP in a week 3)Monitor your blood sugars at home 4)Quit drugs   Increase activity slowly   Complete by: As directed       Allergies as of 06/22/2024   No Known Allergies      Medication List     STOP taking these medications    NovoLIN N 100 UNIT/ML injection Generic drug: insulin  NPH Human       TAKE these medications    apixaban  5 MG Tabs tablet Commonly known as: ELIQUIS  Take 2 tablets (10 mg total) by mouth 2 (two) times daily for 5 days, THEN 1 tablet (5 mg total) 2 (two) times daily. Start taking on: June 22, 2024   Blood Glucose Monitor System w/Device Kit 1 each by Does not apply route as directed. Dispense based on patient and insurance preference. Use up to four times daily as directed. (FOR ICD-10 E10.9, E11.9).   BLOOD GLUCOSE TEST STRIPS Strp 1 each by Does not apply route as directed. Dispense based on patient and insurance preference. Use up to four times daily as directed. (FOR ICD-10 E10.9, E11.9).   Lancet Device Misc 1 each by Does not apply route as directed. Dispense based on patient and insurance preference. Use up to four times daily as directed. (FOR ICD-10 E10.9, E11.9).   Lancets Misc 1 each by Does not apply route as directed. Dispense based on patient and insurance preference. Use up to four times daily as directed. (FOR ICD-10 E10.9, E11.9).   Lantus  SoloStar 100 UNIT/ML Solostar Pen Generic drug: insulin  glargine Inject  20 Units into the skin 2 (two) times daily.   nicotine  21 mg/24hr patch Commonly known as: NICODERM CQ  - dosed in mg/24 hours Place 1 patch (21 mg total) onto the skin daily.   NovoLOG   FlexPen 100 UNIT/ML FlexPen Generic drug: insulin  aspart Inject 5 Units into the skin 3 (three) times daily with meals.   Pen Needles 32G X 4 MM Misc Use 3 (three) times daily.               Durable Medical Equipment  (From admission, onward)           Start     Ordered   06/19/24 1420  For home use only DME Walker rolling  Once       Question Answer Comment  Walker: With 5 Inch Wheels   Patient needs a walker to treat with the following condition Weakness      06/19/24 1420            Follow-up Information     Horton, Lovika, NP. Schedule an appointment as soon as possible for a visit in 1 week(s).   Contact information: [address] [place] [address] [phone]                Allergies[2]  Consultations:    Procedures/Studies: US  EKG SITE RITE Result Date: 06/18/2024 If Site Rite image not attached, placement could not be confirmed due to current cardiac rhythm.  NM Pulmonary Perfusion Result Date: 06/18/2024 EXAM: NM Lung Perfusion Scan. CLINICAL HISTORY: Pulmonary embolism (PE) suspected, high prob. TECHNIQUE: Radiolabeled MAA was administered intravenously and planar images of the lungs were obtained in multiple projections. RADIOPHARMACEUTICAL: 4.4 millicurie (technetium albumin  aggregated (MAA) injection solution 4.4 millicurie TECHNETIUM TO 77M ALBUMIN  AGGREGATED). COMPARISON: Chest radiograph of 06/18/2024. FINDINGS: PERFUSION: Large wedge shaped peripheral perfusion defect within the right upper lobe. No corresponding findings on comparison chest radiograph. Small perfusion defect within the apex of the left upper lobe which is also unmatched. Findings most consistent with bilateral upper lobes pulmonary emboli. High probability. IMPRESSION: 1. Findings most consistent with pulmonary emboli in the bilateral upper lobes, high probability. 2. These results will be called to the ordering clinician or representative by the  radiologist assistant, and communication documented in the pacs or clario dashboard Electronically signed by: Norleen Boxer MD 06/18/2024 11:56 AM EST RP Workstation: [id number]   DG CHEST PORT 1 VIEW Result Date: 06/18/2024 CLINICAL DATA:  Hypoxia. EXAM: PORTABLE CHEST 1 VIEW COMPARISON:  06/15/2024 FINDINGS: The heart size and mediastinal contours are within normal limits. Low lung volumes noted. New mild bibasilar atelectasis. No evidence of focal consolidation or pleural effusion. IMPRESSION: Low lung volumes and mild bibasilar atelectasis. Electronically Signed   By: Norleen DELENA Kil M.D.   On: 06/18/2024 09:32   CT ABDOMEN PELVIS WO CONTRAST Result Date: 06/16/2024 EXAM: CT ABDOMEN AND PELVIS WITHOUT CONTRAST 06/16/2024 05:42:00 AM TECHNIQUE: CT of the abdomen and pelvis was performed without the administration of intravenous contrast. Multiplanar reformatted images are provided for review. Automated exposure control, iterative reconstruction, and/or weight-based adjustment of the mA/kV was utilized to reduce the radiation dose to as low as reasonably achievable. COMPARISON: None available. CLINICAL HISTORY: Abdominal pain, acute, nonlocalized. FINDINGS: LIMITATIONS/ARTIFACTS: The exam is degraded by motion artifact. LOWER CHEST: Pleural parenchymal scarring versus subpleural subsegmental atelectasis noted within the periphery of the lung bases. LIVER: The liver is unremarkable. GALLBLADDER AND BILE DUCTS: Gallbladder is unremarkable. No biliary ductal dilatation. SPLEEN: No acute abnormality.  PANCREAS: No acute abnormality. ADRENAL GLANDS: No acute abnormality. KIDNEYS, URETERS AND BLADDER: No stones in the kidneys or ureters. No hydronephrosis. No perinephric or periureteral stranding. Urinary bladder is unremarkable. GI AND BOWEL: Stomach demonstrates no acute abnormality. The appendix is visualized and appears normal. Scattered colonic diverticula identified without signs of acute diverticulitis. There is  no bowel obstruction. PERITONEUM AND RETROPERITONEUM: No ascites. No free air. No free fluid or fluid collections identified within the abdomen or pelvis. VASCULATURE: Aorta is normal in caliber. Aortic atherosclerotic calcifications. LYMPH NODES: No lymphadenopathy. REPRODUCTIVE ORGANS: No acute abnormality. BONES AND SOFT TISSUES: Increased sclerosis within the subchondral humeral head suggests early avascular necrosis bilaterally. No acute osseous abnormality. No focal soft tissue abnormality. IMPRESSION: 1. No acute findings in the abdomen or pelvis. 2. 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 Electronically signed by: Waddell Calk MD 06/16/2024 06:00 AM EST RP Workstation: [id number]   DG Chest 2 View Result Date: 06/15/2024 EXAM: 2 VIEW(S) XRAY OF THE CHEST 06/15/2024 11:02:00 PM COMPARISON: 08/26/2022. CLINICAL HISTORY: SOB SOB. FINDINGS: LUNGS AND PLEURA: Mild peribronchial thickening compatible with bronchitis. No focal pulmonary opacity. No pleural effusion. No pneumothorax. HEART AND MEDIASTINUM: No acute abnormality of the cardiac and mediastinal silhouettes. BONES AND SOFT TISSUES: No acute osseous abnormality. IMPRESSION: 1. Mild peribronchial thickening compatible with bronchitis. Electronically signed by: Franky Crease MD 06/15/2024 11:37 PM EST RP Workstation: [id number]      Subjective:   Discharge Exam: Vitals:   06/22/24 0436 06/22/24 0813  BP: 136/75 123/71  Pulse: 74 77  Resp: 16 16  Temp: 98.1 F (36.7 C) 97.8 F (36.6 C)  SpO2: 95% 93%   Vitals:   06/21/24 1619 06/21/24 2021 06/22/24 0436 06/22/24 0813  BP: 132/83 137/82 136/75 123/71  Pulse: 72 71 74 77  Resp: 17 17 16 16   Temp: 98.4 F (36.9 C) 98 F (36.7 C) 98.1 F (36.7 C) 97.8 F (36.6 C)  TempSrc: Oral Oral Oral   SpO2: 95% 97% 95% 93%  Weight:      Height:        General: Pt is alert, awake, not in acute distress Cardiovascular: RRR, S1/S2 +, no  rubs, no gallops Respiratory: CTA bilaterally, no wheezing, no rhonchi Abdominal: Soft, NT, ND, bowel sounds + Extremities: no edema, no cyanosis    The results of significant diagnostics from this hospitalization (including imaging, microbiology, ancillary and laboratory) are listed below for reference.     Microbiology: Recent Results (from the past 240 hours)  Resp panel by RT-PCR (RSV, Flu A&B, Covid) Anterior Nasal Swab     Status: None   Collection Time: 06/15/24 11:50 PM   Specimen: Anterior Nasal Swab  Result Value Ref Range Status   SARS Coronavirus 2 by RT PCR NEGATIVE NEGATIVE Final   Influenza A by PCR NEGATIVE NEGATIVE Final   Influenza B by PCR NEGATIVE NEGATIVE Final    Comment: (NOTE) The Xpert Xpress SARS-CoV-2/FLU/RSV plus assay is intended as an aid in the diagnosis of influenza from Nasopharyngeal swab specimens and should not be used as a sole basis for treatment. Nasal washings and aspirates are unacceptable for Xpert Xpress SARS-CoV-2/FLU/RSV testing.  Fact Sheet for Patients: bloggercourse.com  Fact Sheet for Healthcare Providers: seriousbroker.it  This test is not yet approved or cleared by the [place]  FDA and has been authorized for detection and/or diagnosis of SARS-CoV-2 by FDA under an Emergency Use Authorization (EUA). This EUA will remain in effect (meaning  this test can be used) for the duration of the COVID-19 declaration under Section 564(b)(1) of the Act, 21 U.S.C. section 360bbb-3(b)(1), unless the authorization is terminated or revoked.     Resp Syncytial Virus by PCR NEGATIVE NEGATIVE Final    Comment: (NOTE) Fact Sheet for Patients: bloggercourse.com  Fact Sheet for Healthcare Providers: seriousbroker.it  This test is not yet approved or cleared by the [place]  FDA and has been authorized for detection and/or diagnosis of  SARS-CoV-2 by FDA under an Emergency Use Authorization (EUA). This EUA will remain in effect (meaning this test can be used) for the duration of the COVID-19 declaration under Section 564(b)(1) of the Act, 21 U.S.C. section 360bbb-3(b)(1), unless the authorization is terminated or revoked.  Performed at [facility], [address] [place], [place] [address]      Labs: BNP (last 3 results) No results for input(s): BNP in the last 8760 hours. Basic Metabolic Panel: Recent Labs  Lab 06/17/24 0206 06/17/24 0606 06/17/24 1419 06/19/24 0515 06/20/24 1036 06/22/24 0055  NA 161* 153* 151* 143 140 138  K 3.6 4.3 3.9 3.6 3.4* 3.5  CL 122* 117* 113* 109 107 104  CO2 28 23 25 26 25 28   GLUCOSE 210* 293* 284* 335* 329* 257*  BUN 29* 24* 21* 10 10 <5*  CREATININE 1.39* 1.22 1.25* 0.93 0.89 0.70  CALCIUM  9.2 8.6* 8.8* 7.7* 8.1* 8.1*  MG 2.6*  --   --   --   --   --   PHOS 1.9*  --   --   --   --   --    Liver Function Tests: Recent Labs  Lab 06/16/24 0015  AST 24  ALT 18  ALKPHOS 89  BILITOT 0.7  PROT 8.0  ALBUMIN  3.9   Recent Labs  Lab 06/16/24 0015  LIPASE 50   No results for input(s): AMMONIA in the last 168 hours. CBC: Recent Labs  Lab 06/16/24 0015 06/16/24 0026 06/17/24 0220 06/18/24 2115 06/19/24 0515 06/20/24 0332 06/22/24 0055  WBC 10.4   < > 11.9* 10.2 11.3* 11.7* 11.2*  NEUTROABS 8.0*  --   --   --   --   --   --   HGB 18.4*   < > 15.6 13.3 13.1 13.1 12.5*  HCT 54.5*   < > 48.0 39.7 38.7* 38.8* 37.0*  MCV 99.6   < > 103.0* 97.8 97.0 98.0 96.9  PLT 117*   < > 53* 80* 105* 148* 249   < > = values in this interval not displayed.   Cardiac Enzymes: No results for input(s): CKTOTAL, CKMB, CKMBINDEX, TROPONINI in the last 168 hours. BNP: Invalid input(s): POCBNP CBG: Recent Labs  Lab 06/21/24 1621 06/21/24 2023 06/21/24 2355 06/22/24 0438 06/22/24 0813  GLUCAP 77 245* 220* 196* 250*   D-Dimer No results for input(s):  DDIMER in the last 72 hours. Hgb A1c No results for input(s): HGBA1C in the last 72 hours. Lipid Profile No results for input(s): CHOL, HDL, LDLCALC, TRIG, CHOLHDL, LDLDIRECT in the last 72 hours. Thyroid function studies No results for input(s): TSH, T4TOTAL, T3FREE, THYROIDAB in the last 72 hours.  Invalid input(s): FREET3 Anemia work up No results for input(s): VITAMINB12, FOLATE, FERRITIN, TIBC, IRON, RETICCTPCT in the last 72 hours. Urinalysis    Component Value Date/Time   COLORURINE YELLOW 06/16/2024 0248   APPEARANCEUR HAZY (A) 06/16/2024 0248   LABSPEC 1.025 06/16/2024 0248   PHURINE 5.0 06/16/2024 0248  GLUCOSEU >=500 (A) 06/16/2024 0248   HGBUR SMALL (A) 06/16/2024 0248   BILIRUBINUR NEGATIVE 06/16/2024 0248   KETONESUR 5 (A) 06/16/2024 0248   PROTEINUR 30 (A) 06/16/2024 0248   UROBILINOGEN 0.2 11/01/2013 1131   NITRITE NEGATIVE 06/16/2024 0248   LEUKOCYTESUR NEGATIVE 06/16/2024 0248   Sepsis Labs Recent Labs  Lab 06/18/24 2115 06/19/24 0515 06/20/24 0332 06/22/24 0055  WBC 10.2 11.3* 11.7* 11.2*   Microbiology Recent Results (from the past 240 hours)  Resp panel by RT-PCR (RSV, Flu A&B, Covid) Anterior Nasal Swab     Status: None   Collection Time: 06/15/24 11:50 PM   Specimen: Anterior Nasal Swab  Result Value Ref Range Status   SARS Coronavirus 2 by RT PCR NEGATIVE NEGATIVE Final   Influenza A by PCR NEGATIVE NEGATIVE Final   Influenza B by PCR NEGATIVE NEGATIVE Final    Comment: (NOTE) The Xpert Xpress SARS-CoV-2/FLU/RSV plus assay is intended as an aid in the diagnosis of influenza from Nasopharyngeal swab specimens and should not be used as a sole basis for treatment. Nasal washings and aspirates are unacceptable for Xpert Xpress SARS-CoV-2/FLU/RSV testing.  Fact Sheet for Patients: bloggercourse.com  Fact Sheet for Healthcare  Providers: seriousbroker.it  This test is not yet approved or cleared by the [place]  FDA and has been authorized for detection and/or diagnosis of SARS-CoV-2 by FDA under an Emergency Use Authorization (EUA). This EUA will remain in effect (meaning this test can be used) for the duration of the COVID-19 declaration under Section 564(b)(1) of the Act, 21 U.S.C. section 360bbb-3(b)(1), unless the authorization is terminated or revoked.     Resp Syncytial Virus by PCR NEGATIVE NEGATIVE Final    Comment: (NOTE) Fact Sheet for Patients: bloggercourse.com  Fact Sheet for Healthcare Providers: seriousbroker.it  This test is not yet approved or cleared by the [place]  FDA and has been authorized for detection and/or diagnosis of SARS-CoV-2 by FDA under an Emergency Use Authorization (EUA). This EUA will remain in effect (meaning this test can be used) for the duration of the COVID-19 declaration under Section 564(b)(1) of the Act, 21 U.S.C. section 360bbb-3(b)(1), unless the authorization is terminated or revoked.  Performed at [facility], [address], [place], [place] [address]     Please note: You were cared for by a hospitalist during your hospital stay. Once you are discharged, your primary care physician will handle any further medical issues. Please note that NO REFILLS for any discharge medications will be authorized once you are discharged, as it is imperative that you return to your primary care physician (or establish a relationship with a primary care physician if you do not have one) for your post hospital discharge needs so that they can reassess your need for medications and monitor your lab values.    Time coordinating discharge: 40 minutes  SIGNED:   Ivonne Mustache, MD  [facility] 06/22/2024, 10:39 AM Pager [phone]  If 7PM-7AM, please contact  night-coverage [email or web address] Password [email or web address]     [1] No Known Allergies [2] No Known Allergies

## 2024-06-22 ENCOUNTER — Other Ambulatory Visit (HOSPITAL_COMMUNITY): Payer: Self-pay

## 2024-06-22 LAB — BASIC METABOLIC PANEL WITH GFR
Anion gap: 6 (ref 5–15)
BUN: <5 mg/dL — ABNORMAL LOW (ref 6–20)
CO2: 28 mmol/L (ref 22–32)
Calcium: 8.1 mg/dL — ABNORMAL LOW (ref 8.9–10.3)
Chloride: 104 mmol/L (ref 98–111)
Creatinine, Ser: 0.7 mg/dL (ref 0.61–1.24)
GFR, Estimated: >60 mL/min
Glucose, Bld: 257 mg/dL — ABNORMAL HIGH (ref 70–99)
Potassium: 3.5 mmol/L (ref 3.5–5.1)
Sodium: 138 mmol/L (ref 135–145)

## 2024-06-22 LAB — CBC
HCT: 37 % — ABNORMAL LOW (ref 39.0–52.0)
Hemoglobin: 12.5 g/dL — ABNORMAL LOW (ref 13.0–17.0)
MCH: 32.7 pg (ref 26.0–34.0)
MCHC: 33.8 g/dL (ref 30.0–36.0)
MCV: 96.9 fL (ref 80.0–100.0)
Platelets: 249 K/uL (ref 150–400)
RBC: 3.82 MIL/uL — ABNORMAL LOW (ref 4.22–5.81)
RDW: 12.4 % (ref 11.5–15.5)
WBC: 11.2 K/uL — ABNORMAL HIGH (ref 4.0–10.5)
nRBC: 0 % (ref 0.0–0.2)

## 2024-06-22 LAB — GLUCOSE, CAPILLARY
Glucose-Capillary: 196 mg/dL — ABNORMAL HIGH (ref 70–99)
Glucose-Capillary: 250 mg/dL — ABNORMAL HIGH (ref 70–99)
Glucose-Capillary: 182 mg/dL — ABNORMAL HIGH (ref 70–99)

## 2024-06-22 NOTE — Plan of Care (Signed)

## 2024-06-22 NOTE — Progress Notes (Signed)
 Patient discharged, PICC line discontinued, IV discontinued. Medications discussed, follow up appointments discussed. Education given. Patient to go to discharge lounge waiting on ride to his family's home. Belongings with patient

## 2024-06-22 NOTE — Progress Notes (Signed)
 PT Cancellation Note  Patient Details Name: CALEB DECOCK MRN: [id number] DOB: April 13, 1966   Cancelled Treatment:   Pt presents supine in bed asleep w/ family member present.  Pt arouses to state will walk later on today.  Pt states did not sleep at all last night.  Encouraged pt to improve mobility and endurance for safe D/C to home, but still refusing.  Will attempt treatment later today.     Umberto Pavek P Kadin Canipe 06/22/2024, 8:54 AM

## 2024-06-22 NOTE — Progress Notes (Signed)
 PT Cancellation Note  Patient Details Name: Mike Small MRN: [id number] DOB: 1966/02/11   Cancelled Treatment:    Reason Eval/Treat Not Completed: Patient declined, no reason specified  [name], DPT, CLT  Acute Rehabilitation Services Office: [phone] (Secure chat preferred)   [name] 06/22/2024, 11:54 AM

## 2024-06-22 NOTE — Progress Notes (Signed)
 PICC removed per protocol per MD order. Manual pressure applied for 5 mins. Vaseline gauze, gauze, and Tegaderm applied over insertion site. No bleeding or swelling noted. Instructed patient to remain in bed for thirty mins. Educated patient about S/S of infection and when to call MD; no heavy lifting or pressure on right side for 24 hours; keep dressing dry and intact for 24 hours. Pt verbalized comprehension.

## 2024-06-22 NOTE — TOC Transition Note (Signed)
 Transition of Care [facility]) - Discharge Note   Patient Details  Name: Mike Small MRN: [id number] Date of Birth: [birth year]  [facility] CM/SW Contact:  Tom-Johnson, Harvest Muskrat, RN Phone Number: 06/22/2024, 11:00 AM   Clinical Narrative:     Patient is scheduled for discharge today.  Readmission Risk Assessment done. Hospital f/u and discharge instructions on AVS. Prescriptions sent to [facility] pharmacy and patient will receive meds prior discharge. Wife, angela at bedside, states their daughter will transport at discharge.  No further ICM needs noted.       Final next level of care: Home/Self Care Barriers to Discharge: Barriers Resolved   Patient Goals and CMS Choice Patient states their goals for this hospitalization and ongoing recovery are:: To return home CMS Medicare.gov Compare Post Acute Care list provided to:: Patient Choice offered to / list presented to : Patient, Spouse      Discharge Placement                Patient to be transferred to facility by: Daughter Name of family member notified: Wife, Jon at bedside.    Discharge Plan and Services Additional resources added to the After Visit Summary for     Discharge Planning Services: CM Consult            DME Arranged: N/A DME Agency: NA       HH Arranged: NA HH Agency: NA        Social Drivers of Health (SDOH) Interventions SDOH Screenings   Food Insecurity: Food Insecurity Present (06/18/2024)  Housing: High Risk (06/18/2024)  Transportation Needs: No Transportation Needs (06/18/2024)  Utilities: At Risk (06/18/2024)  Social Connections: Unknown (06/18/2024)  Tobacco Use: Medium Risk (06/15/2024)     Readmission Risk Interventions     No data to display

## 2024-06-22 NOTE — Progress Notes (Signed)
 EOS No acute overnight events Pt asked for orange juice; pre packaged desserts noted on pt's table Pt states that he hasn't had a BM in 2 weeks - Bowel sounds present, hyper active, soft & non tender abdomen

## 2024-06-22 NOTE — Progress Notes (Signed)
 Pt's wife approached nursing desk asking for 10 packets of sugar  She asked for her personal nutritional item out of the fridge behind the nurses desk  Pt's wife was then informed by this RN with charge RN as witness that we CANNOT put pt's personal nutrition items in fridge per policy

## 2024-06-22 NOTE — Plan of Care (Signed)
" °  Problem: Fluid Volume: Goal: Ability to maintain a balanced intake and output will improve Outcome: Progressing   Problem: Metabolic: Goal: Ability to maintain appropriate glucose levels will improve Outcome: Progressing   Problem: Nutritional: Goal: Maintenance of adequate nutrition will improve Outcome: Progressing   Problem: Health Behavior/Discharge Planning: Goal: Ability to manage health-related needs will improve Outcome: Progressing   "
